# Patient Record
Sex: Female | Born: 1985 | Race: White | Hispanic: No | Marital: Married | Smoking: Never smoker
Health system: Southern US, Community
[De-identification: ages and names within clinical notes are randomized; demographics above are authoritative.]

## PROBLEM LIST (undated history)

## (undated) DIAGNOSIS — R87629 Unspecified abnormal cytological findings in specimens from vagina: Secondary | ICD-10-CM

## (undated) HISTORY — PX: WISDOM TOOTH EXTRACTION: SHX21

## (undated) HISTORY — DX: Unspecified abnormal cytological findings in specimens from vagina: R87.629

---

## 2020-06-23 LAB — OB RESULTS CONSOLE RPR: RPR: NONREACTIVE

## 2020-06-23 LAB — OB RESULTS CONSOLE ABO/RH: RH Type: POSITIVE

## 2020-06-23 LAB — OB RESULTS CONSOLE GC/CHLAMYDIA
Chlamydia: NEGATIVE
Gonorrhea: NEGATIVE

## 2020-06-23 LAB — OB RESULTS CONSOLE HIV ANTIBODY (ROUTINE TESTING): HIV: NONREACTIVE

## 2020-06-23 LAB — OB RESULTS CONSOLE HEPATITIS B SURFACE ANTIGEN: Hepatitis B Surface Ag: NEGATIVE

## 2020-06-23 LAB — OB RESULTS CONSOLE RUBELLA ANTIBODY, IGM: Rubella: IMMUNE

## 2020-10-22 ENCOUNTER — Inpatient Hospital Stay (HOSPITAL_COMMUNITY): Admission: AD | Admit: 2020-10-22 | Payer: 59 | Source: Home / Self Care | Admitting: Obstetrics and Gynecology

## 2021-01-21 ENCOUNTER — Inpatient Hospital Stay (HOSPITAL_COMMUNITY): Payer: BC Managed Care – PPO | Admitting: Anesthesiology

## 2021-01-21 ENCOUNTER — Encounter (HOSPITAL_COMMUNITY): Payer: Self-pay | Admitting: Obstetrics and Gynecology

## 2021-01-21 ENCOUNTER — Other Ambulatory Visit: Payer: Self-pay

## 2021-01-21 ENCOUNTER — Inpatient Hospital Stay (HOSPITAL_COMMUNITY)
Admission: AD | Admit: 2021-01-21 | Discharge: 2021-01-23 | DRG: 788 | Disposition: A | Discharge status: Home / Self Care | Payer: BC Managed Care – PPO | Attending: Obstetrics & Gynecology | Admitting: Obstetrics & Gynecology

## 2021-01-21 ENCOUNTER — Encounter (HOSPITAL_COMMUNITY)
Admission: AD | Disposition: A | Discharge status: Home / Self Care | Payer: Self-pay | Source: Home / Self Care | Attending: Obstetrics & Gynecology

## 2021-01-21 DIAGNOSIS — Z8616 Personal history of COVID-19: Secondary | ICD-10-CM

## 2021-01-21 DIAGNOSIS — O26893 Other specified pregnancy related conditions, third trimester: Secondary | ICD-10-CM | POA: Diagnosis present

## 2021-01-21 DIAGNOSIS — Z3A39 39 weeks gestation of pregnancy: Secondary | ICD-10-CM

## 2021-01-21 DIAGNOSIS — Z98891 History of uterine scar from previous surgery: Secondary | ICD-10-CM

## 2021-01-21 DIAGNOSIS — O99824 Streptococcus B carrier state complicating childbirth: Secondary | ICD-10-CM | POA: Diagnosis present

## 2021-01-21 DIAGNOSIS — O9081 Anemia of the puerperium: Secondary | ICD-10-CM | POA: Diagnosis not present

## 2021-01-21 LAB — TYPE AND SCREEN
ABO/RH(D): O POS
Antibody Screen: NEGATIVE

## 2021-01-21 LAB — CBC
HCT: 35.9 % — ABNORMAL LOW (ref 36.0–46.0)
Hemoglobin: 11.3 g/dL — ABNORMAL LOW (ref 12.0–15.0)
MCH: 28.6 pg (ref 26.0–34.0)
MCHC: 31.5 g/dL (ref 30.0–36.0)
MCV: 90.9 fL (ref 80.0–100.0)
Platelets: 203 10*3/uL (ref 150–400)
RBC: 3.95 MIL/uL (ref 3.87–5.11)
RDW: 13.9 % (ref 11.5–15.5)
WBC: 12.2 10*3/uL — ABNORMAL HIGH (ref 4.0–10.5)
nRBC: 0 % (ref 0.0–0.2)

## 2021-01-21 LAB — OB RESULTS CONSOLE GBS: GBS: POSITIVE

## 2021-01-21 LAB — RPR: RPR Ser Ql: NONREACTIVE

## 2021-01-21 LAB — POCT FERN TEST

## 2021-01-21 SURGERY — Surgical Case
Anesthesia: Epidural

## 2021-01-21 MED ORDER — ONDANSETRON HCL 4 MG/2ML IJ SOLN: 4.0000 mg | Freq: Three times a day (TID) | INTRAMUSCULAR | Status: DC | PRN

## 2021-01-21 MED ORDER — OXYTOCIN-SODIUM CHLORIDE 30-0.9 UT/500ML-% IV SOLN
INTRAVENOUS | Status: AC
  Filled 2021-01-21: qty 500

## 2021-01-21 MED ORDER — OXYTOCIN-SODIUM CHLORIDE 30-0.9 UT/500ML-% IV SOLN
INTRAVENOUS | Status: DC | PRN
  Administered 2021-01-21: 400 mL via INTRAVENOUS

## 2021-01-21 MED ORDER — PENICILLIN G POT IN DEXTROSE 60000 UNIT/ML IV SOLN
3.0000 10*6.[IU] | INTRAVENOUS | Status: DC
  Administered 2021-01-21 (×2): 3 10*6.[IU] via INTRAVENOUS
  Filled 2021-01-21 (×2): qty 50

## 2021-01-21 MED ORDER — MENTHOL 3 MG MT LOZG: 1.0000 | LOZENGE | OROMUCOSAL | Status: DC | PRN

## 2021-01-21 MED ORDER — SODIUM CHLORIDE 0.9 % IV SOLN
500.0000 mg | Freq: Once | INTRAVENOUS | Status: AC
  Administered 2021-01-21: 500 mg via INTRAVENOUS

## 2021-01-21 MED ORDER — SIMETHICONE 80 MG PO CHEW
80.0000 mg | CHEWABLE_TABLET | Freq: Three times a day (TID) | ORAL | Status: DC
  Administered 2021-01-21 – 2021-01-23 (×5): 80 mg via ORAL
  Filled 2021-01-21 (×4): qty 1

## 2021-01-21 MED ORDER — MEPERIDINE HCL 25 MG/ML IJ SOLN: 6.2500 mg | INTRAMUSCULAR | Status: DC | PRN

## 2021-01-21 MED ORDER — OXYTOCIN BOLUS FROM INFUSION: 333.0000 mL | Freq: Once | INTRAVENOUS | Status: DC

## 2021-01-21 MED ORDER — ONDANSETRON HCL 4 MG/2ML IJ SOLN
INTRAMUSCULAR | Status: DC | PRN
  Administered 2021-01-21: 4 mg via INTRAVENOUS

## 2021-01-21 MED ORDER — FLEET ENEMA 7-19 GM/118ML RE ENEM: 1.0000 | ENEMA | Freq: Every day | RECTAL | Status: DC | PRN

## 2021-01-21 MED ORDER — NALBUPHINE HCL 10 MG/ML IJ SOLN: 5.0000 mg | Freq: Once | INTRAMUSCULAR | Status: DC | PRN

## 2021-01-21 MED ORDER — NALBUPHINE HCL 10 MG/ML IJ SOLN: 5.0000 mg | INTRAMUSCULAR | Status: DC | PRN

## 2021-01-21 MED ORDER — LACTATED RINGERS IV SOLN
500.0000 mL | INTRAVENOUS | Status: DC | PRN
  Administered 2021-01-21: 250 mL via INTRAVENOUS

## 2021-01-21 MED ORDER — LACTATED RINGERS IV SOLN: 500.0000 mL | Freq: Once | INTRAVENOUS | Status: DC

## 2021-01-21 MED ORDER — LACTATED RINGERS IV SOLN: INTRAVENOUS | Status: DC | PRN

## 2021-01-21 MED ORDER — EPHEDRINE 5 MG/ML INJ: 10.0000 mg | INTRAVENOUS | Status: DC | PRN

## 2021-01-21 MED ORDER — BISACODYL 10 MG RE SUPP: 10.0000 mg | Freq: Every day | RECTAL | Status: DC | PRN

## 2021-01-21 MED ORDER — PRENATAL MULTIVITAMIN CH
1.0000 | ORAL_TABLET | Freq: Every day | ORAL | Status: DC
  Administered 2021-01-22 – 2021-01-23 (×2): 1 via ORAL
  Filled 2021-01-21 (×2): qty 1

## 2021-01-21 MED ORDER — DEXAMETHASONE SODIUM PHOSPHATE 4 MG/ML IJ SOLN
INTRAMUSCULAR | Status: AC
  Filled 2021-01-21: qty 1

## 2021-01-21 MED ORDER — FLEET ENEMA 7-19 GM/118ML RE ENEM: 1.0000 | ENEMA | RECTAL | Status: DC | PRN

## 2021-01-21 MED ORDER — SCOPOLAMINE 1 MG/3DAYS TD PT72: 1.0000 | MEDICATED_PATCH | Freq: Once | TRANSDERMAL | Status: DC

## 2021-01-21 MED ORDER — ONDANSETRON HCL 4 MG/2ML IJ SOLN
INTRAMUSCULAR | Status: AC
  Filled 2021-01-21: qty 2

## 2021-01-21 MED ORDER — SOD CITRATE-CITRIC ACID 500-334 MG/5ML PO SOLN
30.0000 mL | ORAL | Status: DC | PRN
  Administered 2021-01-21: 30 mL via ORAL
  Filled 2021-01-21: qty 15

## 2021-01-21 MED ORDER — KETOROLAC TROMETHAMINE 30 MG/ML IJ SOLN
30.0000 mg | Freq: Four times a day (QID) | INTRAMUSCULAR | Status: AC
  Administered 2021-01-21 – 2021-01-22 (×4): 30 mg via INTRAVENOUS
  Filled 2021-01-21 (×4): qty 1

## 2021-01-21 MED ORDER — DIBUCAINE (PERIANAL) 1 % EX OINT
1.0000 | TOPICAL_OINTMENT | CUTANEOUS | Status: DC | PRN
Start: 2021-01-21 — End: 2021-01-23

## 2021-01-21 MED ORDER — TERBUTALINE SULFATE 1 MG/ML IJ SOLN: 0.2500 mg | Freq: Once | INTRAMUSCULAR | Status: DC | PRN

## 2021-01-21 MED ORDER — SENNOSIDES-DOCUSATE SODIUM 8.6-50 MG PO TABS
2.0000 | ORAL_TABLET | ORAL | Status: DC
  Administered 2021-01-22 – 2021-01-23 (×2): 2 via ORAL
  Filled 2021-01-21 (×2): qty 2

## 2021-01-21 MED ORDER — SODIUM CHLORIDE 0.9 % IV SOLN
INTRAVENOUS | Status: AC
  Filled 2021-01-21: qty 500

## 2021-01-21 MED ORDER — LIDOCAINE HCL (PF) 1 % IJ SOLN: 30.0000 mL | INTRAMUSCULAR | Status: DC | PRN

## 2021-01-21 MED ORDER — FENTANYL CITRATE (PF) 100 MCG/2ML IJ SOLN
INTRAMUSCULAR | Status: AC
  Filled 2021-01-21: qty 2

## 2021-01-21 MED ORDER — MORPHINE SULFATE (PF) 0.5 MG/ML IJ SOLN
INTRAMUSCULAR | Status: DC | PRN
  Administered 2021-01-21: 3 mg via EPIDURAL

## 2021-01-21 MED ORDER — OXYCODONE-ACETAMINOPHEN 5-325 MG PO TABS: 2.0000 | ORAL_TABLET | ORAL | Status: DC | PRN

## 2021-01-21 MED ORDER — OXYTOCIN-SODIUM CHLORIDE 30-0.9 UT/500ML-% IV SOLN
1.0000 m[IU]/min | INTRAVENOUS | Status: DC
  Administered 2021-01-21: 2 m[IU]/min via INTRAVENOUS

## 2021-01-21 MED ORDER — MORPHINE SULFATE (PF) 0.5 MG/ML IJ SOLN
INTRAMUSCULAR | Status: AC
  Filled 2021-01-21: qty 10

## 2021-01-21 MED ORDER — MEPERIDINE HCL 25 MG/ML IJ SOLN
INTRAMUSCULAR | Status: AC
  Filled 2021-01-21: qty 1

## 2021-01-21 MED ORDER — OXYTOCIN-SODIUM CHLORIDE 30-0.9 UT/500ML-% IV SOLN
2.5000 [IU]/h | INTRAVENOUS | Status: DC
  Filled 2021-01-21: qty 500

## 2021-01-21 MED ORDER — NALOXONE HCL 4 MG/10ML IJ SOLN
1.0000 ug/kg/h | INTRAVENOUS | Status: DC | PRN
  Filled 2021-01-21: qty 5

## 2021-01-21 MED ORDER — COCONUT OIL OIL
1.0000 | TOPICAL_OIL | Status: DC | PRN
Start: 2021-01-21 — End: 2021-01-23

## 2021-01-21 MED ORDER — OXYCODONE HCL 5 MG PO TABS
5.0000 mg | ORAL_TABLET | ORAL | Status: DC | PRN
  Filled 2021-01-21: qty 2

## 2021-01-21 MED ORDER — PHENYLEPHRINE 40 MCG/ML (10ML) SYRINGE FOR IV PUSH (FOR BLOOD PRESSURE SUPPORT): 80.0000 ug | PREFILLED_SYRINGE | INTRAVENOUS | Status: DC | PRN

## 2021-01-21 MED ORDER — SODIUM CHLORIDE 0.9 % IR SOLN
Status: DC | PRN
  Administered 2021-01-21: 1

## 2021-01-21 MED ORDER — ACETAMINOPHEN 500 MG PO TABS
1000.0000 mg | ORAL_TABLET | Freq: Four times a day (QID) | ORAL | Status: DC
  Administered 2021-01-21 – 2021-01-23 (×7): 1000 mg via ORAL
  Filled 2021-01-21 (×6): qty 2

## 2021-01-21 MED ORDER — CEFAZOLIN SODIUM-DEXTROSE 2-4 GM/100ML-% IV SOLN
2.0000 g | INTRAVENOUS | Status: AC
  Administered 2021-01-21: 2 g via INTRAVENOUS

## 2021-01-21 MED ORDER — LIDOCAINE-EPINEPHRINE (PF) 2 %-1:200000 IJ SOLN
INTRAMUSCULAR | Status: DC | PRN
  Administered 2021-01-21: 5 mL via EPIDURAL
  Administered 2021-01-21: 3 mL via EPIDURAL
  Administered 2021-01-21: 5 mL via EPIDURAL
  Administered 2021-01-21: 4 mL via EPIDURAL

## 2021-01-21 MED ORDER — LACTATED RINGERS IV SOLN: INTRAVENOUS | Status: DC

## 2021-01-21 MED ORDER — NALOXONE HCL 0.4 MG/ML IJ SOLN
0.4000 mg | INTRAMUSCULAR | Status: DC | PRN
Start: 2021-01-21 — End: 2021-01-23

## 2021-01-21 MED ORDER — DIPHENHYDRAMINE HCL 25 MG PO CAPS: 25.0000 mg | ORAL_CAPSULE | Freq: Four times a day (QID) | ORAL | Status: DC | PRN

## 2021-01-21 MED ORDER — MEPERIDINE HCL 25 MG/ML IJ SOLN
INTRAMUSCULAR | Status: DC | PRN
  Administered 2021-01-21 (×2): 12.5 mg via INTRAVENOUS

## 2021-01-21 MED ORDER — FENTANYL-BUPIVACAINE-NACL 0.5-0.125-0.9 MG/250ML-% EP SOLN
12.0000 mL/h | EPIDURAL | Status: DC | PRN
  Administered 2021-01-21: 12 mL/h via EPIDURAL

## 2021-01-21 MED ORDER — ZOLPIDEM TARTRATE 5 MG PO TABS: 5.0000 mg | ORAL_TABLET | Freq: Every evening | ORAL | Status: DC | PRN

## 2021-01-21 MED ORDER — SODIUM CHLORIDE 0.9% FLUSH: 3.0000 mL | INTRAVENOUS | Status: DC | PRN

## 2021-01-21 MED ORDER — FENTANYL CITRATE (PF) 100 MCG/2ML IJ SOLN
25.0000 ug | INTRAMUSCULAR | Status: DC | PRN
  Administered 2021-01-21: 50 ug via INTRAVENOUS

## 2021-01-21 MED ORDER — HYDROMORPHONE HCL 1 MG/ML IJ SOLN: 0.2000 mg | INTRAMUSCULAR | Status: DC | PRN

## 2021-01-21 MED ORDER — OXYCODONE-ACETAMINOPHEN 5-325 MG PO TABS: 1.0000 | ORAL_TABLET | ORAL | Status: DC | PRN

## 2021-01-21 MED ORDER — SOD CITRATE-CITRIC ACID 500-334 MG/5ML PO SOLN: 30.0000 mL | ORAL | Status: DC

## 2021-01-21 MED ORDER — DIPHENHYDRAMINE HCL 50 MG/ML IJ SOLN: 12.5000 mg | INTRAMUSCULAR | Status: DC | PRN

## 2021-01-21 MED ORDER — DEXAMETHASONE SODIUM PHOSPHATE 4 MG/ML IJ SOLN
INTRAMUSCULAR | Status: DC | PRN
  Administered 2021-01-21: 4 mg via INTRAVENOUS

## 2021-01-21 MED ORDER — SIMETHICONE 80 MG PO CHEW: 80.0000 mg | CHEWABLE_TABLET | ORAL | Status: DC | PRN

## 2021-01-21 MED ORDER — IBUPROFEN 800 MG PO TABS
800.0000 mg | ORAL_TABLET | Freq: Four times a day (QID) | ORAL | Status: DC
  Administered 2021-01-22 – 2021-01-23 (×4): 800 mg via ORAL
  Filled 2021-01-21 (×4): qty 1

## 2021-01-21 MED ORDER — ACETAMINOPHEN 325 MG PO TABS: 650.0000 mg | ORAL_TABLET | ORAL | Status: DC | PRN

## 2021-01-21 MED ORDER — OXYTOCIN-SODIUM CHLORIDE 30-0.9 UT/500ML-% IV SOLN
2.5000 [IU]/h | INTRAVENOUS | Status: AC
  Administered 2021-01-21: 2.5 [IU]/h via INTRAVENOUS
  Filled 2021-01-21: qty 500

## 2021-01-21 MED ORDER — ONDANSETRON HCL 4 MG/2ML IJ SOLN: 4.0000 mg | Freq: Four times a day (QID) | INTRAMUSCULAR | Status: DC | PRN

## 2021-01-21 MED ORDER — FENTANYL-BUPIVACAINE-NACL 0.5-0.125-0.9 MG/250ML-% EP SOLN
EPIDURAL | Status: AC
  Filled 2021-01-21: qty 250

## 2021-01-21 MED ORDER — WITCH HAZEL-GLYCERIN EX PADS
1.0000 | MEDICATED_PAD | CUTANEOUS | Status: DC | PRN
Start: 2021-01-21 — End: 2021-01-23

## 2021-01-21 MED ORDER — SODIUM CHLORIDE 0.9 % IV SOLN
5.0000 10*6.[IU] | Freq: Once | INTRAVENOUS | Status: AC
  Administered 2021-01-21: 5 10*6.[IU] via INTRAVENOUS
  Filled 2021-01-21: qty 5

## 2021-01-21 MED ORDER — DIPHENHYDRAMINE HCL 25 MG PO CAPS: 25.0000 mg | ORAL_CAPSULE | ORAL | Status: DC | PRN

## 2021-01-21 SURGICAL SUPPLY — 35 items
BENZOIN TINCTURE PRP APPL 2/3 (GAUZE/BANDAGES/DRESSINGS) ×2 IMPLANT
CHLORAPREP W/TINT 26ML (MISCELLANEOUS) ×2 IMPLANT
CLAMP CORD UMBIL (MISCELLANEOUS) IMPLANT
CLOSURE STERI STRIP 1/2 X4 (GAUZE/BANDAGES/DRESSINGS) ×2 IMPLANT
CLOTH BEACON ORANGE TIMEOUT ST (SAFETY) ×2 IMPLANT
DRSG OPSITE POSTOP 4X10 (GAUZE/BANDAGES/DRESSINGS) ×2 IMPLANT
ELECT REM PT RETURN 9FT ADLT (ELECTROSURGICAL) ×2
ELECTRODE REM PT RTRN 9FT ADLT (ELECTROSURGICAL) ×1 IMPLANT
EXTRACTOR VACUUM KIWI (MISCELLANEOUS) ×2 IMPLANT
GLOVE BIO SURGEON STRL SZ 6 (GLOVE) ×2 IMPLANT
GLOVE BIOGEL PI IND STRL 6 (GLOVE) ×1 IMPLANT
GLOVE BIOGEL PI IND STRL 7.0 (GLOVE) ×1 IMPLANT
GLOVE BIOGEL PI INDICATOR 6 (GLOVE) ×1
GLOVE BIOGEL PI INDICATOR 7.0 (GLOVE) ×1
GOWN STRL REUS W/TWL LRG LVL3 (GOWN DISPOSABLE) ×4 IMPLANT
KIT ABG SYR 3ML LUER SLIP (SYRINGE) IMPLANT
NEEDLE HYPO 25X5/8 SAFETYGLIDE (NEEDLE) IMPLANT
NS IRRIG 1000ML POUR BTL (IV SOLUTION) ×2 IMPLANT
PACK C SECTION WH (CUSTOM PROCEDURE TRAY) ×2 IMPLANT
PAD OB MATERNITY 4.3X12.25 (PERSONAL CARE ITEMS) ×2 IMPLANT
PENCIL SMOKE EVAC W/HOLSTER (ELECTROSURGICAL) ×2 IMPLANT
RTRCTR C-SECT PINK 25CM LRG (MISCELLANEOUS) ×2 IMPLANT
STRIP CLOSURE SKIN 1/2X4 (GAUZE/BANDAGES/DRESSINGS) ×2 IMPLANT
SUT MNCRL 0 VIOLET CTX 36 (SUTURE) ×1 IMPLANT
SUT MONOCRYL 0 CTX 36 (SUTURE) ×1
SUT PDS AB 0 CTX 60 (SUTURE) ×2 IMPLANT
SUT PLAIN 0 NONE (SUTURE) IMPLANT
SUT VIC AB 0 CTX 36 (SUTURE) ×2
SUT VIC AB 0 CTX36XBRD ANBCTRL (SUTURE) ×2 IMPLANT
SUT VIC AB 2-0 CT1 27 (SUTURE) ×4
SUT VIC AB 2-0 CT1 TAPERPNT 27 (SUTURE) ×4 IMPLANT
SUT VIC AB 4-0 KS 27 (SUTURE) ×2 IMPLANT
TOWEL OR 17X24 6PK STRL BLUE (TOWEL DISPOSABLE) ×2 IMPLANT
TRAY FOLEY W/BAG SLVR 14FR LF (SET/KITS/TRAYS/PACK) ×2 IMPLANT
WATER STERILE IRR 1000ML POUR (IV SOLUTION) ×2 IMPLANT

## 2021-01-21 NOTE — Brief Op Note (Signed)
01/21/2021  4:17 PM  PATIENT:  Brianna Stewart  34 y.o. female  PRE-OPERATIVE DIAGNOSIS:  primary cesarean section; arrest of descent  POST-OPERATIVE DIAGNOSIS:  same  PROCEDURE:  Procedure(s): CESAREAN SECTION (N/A)  SURGEON:  Surgeon(s) and Role:    * Taam-Akelman, Griselda Miner, MD - Primary   ANESTHESIA:   epidural  EBL: 226cc  BLOOD ADMINISTERED:none  DRAINS: none   LOCAL MEDICATIONS USED:  NONE  SPECIMEN:  Source of Specimen:  placenta  DISPOSITION OF SPECIMEN:  L&D  COUNTS:  YES  TOURNIQUET:  * No tourniquets in log *  DICTATION: .Note written in EPIC  PLAN OF CARE: Admit to inpatient   PATIENT DISPOSITION:  PACU - hemodynamically stable.   Delay start of Pharmacological VTE agent (>24hrs) due to surgical blood loss or risk of bleeding: not applicable

## 2021-01-21 NOTE — Anesthesia Postprocedure Evaluation (Signed)
Anesthesia Post Note  Patient: Brianna Stewart  Procedure(s) Performed: CESAREAN SECTION (N/A )     Patient location during evaluation: PACU Anesthesia Type: Epidural Level of consciousness: awake and alert Pain management: pain level controlled Vital Signs Assessment: post-procedure vital signs reviewed and stable Respiratory status: spontaneous breathing and respiratory function stable Cardiovascular status: blood pressure returned to baseline and stable Postop Assessment: spinal receding Anesthetic complications: no   No complications documented.  Last Vitals:  Vitals:   01/21/21 1700 01/21/21 1715  BP: 131/66 120/69  Pulse: 82 78  Resp: 19 18  Temp:    SpO2: 100% 98%    Last Pain:  Vitals:   01/21/21 1645  TempSrc: Axillary  PainSc:    Pain Goal:                Epidural/Spinal Function Cutaneous sensation: Able to Wiggle Toes (01/21/21 1700), Patient able to flex knees: No (01/21/21 1700), Patient able to lift hips off bed: No (01/21/21 1700), Back pain beyond tenderness at insertion site: No (01/21/21 1700), Progressively worsening motor and/or sensory loss: No (01/21/21 1700), Bowel and/or bladder incontinence post epidural: No (01/21/21 1700)  Haru Anspaugh DANIEL

## 2021-01-21 NOTE — Plan of Care (Signed)

## 2021-01-21 NOTE — MAU Note (Signed)
Patient states contractions started around 0030, with SROM of clear fluid at this same time.

## 2021-01-21 NOTE — Anesthesia Procedure Notes (Signed)
Epidural Patient location during procedure: OB Start time: 01/21/2021 5:45 AM End time: 01/21/2021 5:55 AM  Staffing Anesthesiologist: Elmer Picker, MD Performed: anesthesiologist   Preanesthetic Checklist Completed: patient identified, IV checked, risks and benefits discussed, monitors and equipment checked, pre-op evaluation and timeout performed  Epidural Patient position: sitting Prep: DuraPrep and site prepped and draped Patient monitoring: continuous pulse ox, blood pressure, heart rate and cardiac monitor Approach: midline Location: L3-L4 Injection technique: LOR air  Needle:  Needle type: Tuohy  Needle gauge: 17 G Needle length: 9 cm Needle insertion depth: 4 cm Catheter type: closed end flexible Catheter size: 19 Gauge Catheter at skin depth: 10 cm Test dose: negative  Assessment Sensory level: T8 Events: blood not aspirated, injection not painful, no injection resistance, no paresthesia and negative IV test  Additional Notes Patient identified. Risks/Benefits/Options discussed with patient including but not limited to bleeding, infection, nerve damage, paralysis, failed block, incomplete pain control, headache, blood pressure changes, nausea, vomiting, reactions to medication both or allergic, itching and postpartum back pain. Confirmed with bedside nurse the patient's most recent platelet count. Confirmed with patient that they are not currently taking any anticoagulation, have any bleeding history or any family history of bleeding disorders. Patient expressed understanding and wished to proceed. All questions were answered. Sterile technique was used throughout the entire procedure. Please see nursing notes for vital signs. Test dose was given through epidural catheter and negative prior to continuing to dose epidural or start infusion. Warning signs of high block given to the patient including shortness of breath, tingling/numbness in hands, complete motor block,  or any concerning symptoms with instructions to call for help. Patient was given instructions on fall risk and not to get out of bed. All questions and concerns addressed with instructions to call with any issues or inadequate analgesia.  Reason for block:procedure for pain

## 2021-01-21 NOTE — Op Note (Signed)
Procedure(s): CESAREAN SECTION Procedure Note  Brianna Stewart female 35 y.o. 01/21/2021  Procedure(s) and Anesthesia Type:    * CESAREAN SECTION - Regional  Surgeon(s) and Role:    * Taam-Akelman, Griselda Miner, MD - Primary  Indications: Brianna Stewart 35 y.o. [id number] [redacted]w[redacted]d admitted this AM for SROM/labor, progressed well to 10/100/0, labored down for approximately 45 mins and then pushed for 3 hours, progressed to 10/100/+2 but in the last 1.5 hours of pushing had minimal change. Fetal status reassuring. EFW 8lb 4oz by recent US and 8# by leopolds. Counseled patient on continued pushing for 1 more hour or cesarean section for arrest of descent, patient opted for primary cesarean section.      Surgeon: Rande Brunt   Anesthesia: Epidural anesthesia   Procedure Detail  CESAREAN SECTION  Findings: Normal mullerian anatomy.  Viable female infant "Max" with weight 4015g (8pounds 13.6ounces) Apgars 9 and 9.   Estimated Blood Loss: 376cc         Specimens: Placenta to L&D         Complications:  None         Disposition: PACU - hemodynamically stable.         Condition: stable    Description of Procedure: The patient was taken to the operating room where epidural anesthesia was found to be adequate.  The patient was placed in the dorsal supine position.  Fetal heart tones were confirmed.  The patient was subsequently prepped and draped in the normal sterile fashion.    A low transverse skin incision was made with a scalpel and carried down to the level of the fascia with the Bovie.  The fascia was incised in the midline with the scalpel and extended laterally with curved Mayo scissors.  Kocher clamps were applied to the superior fascial edge and the fascia was dissected off the rectus muscle sharply using the scalpel.  The Kocher clamps were transferred to the inferior fascial edge and the underlying rectus muscle was dissected off with curved Mayo's scissors.  The rectus  muscles then were separated in the midline.  The peritoneum was found free of adherent bowel and the peritoneal cavity was entered sharply.  The uterus was identified and the alexis retractor was placed intraperitoneal.  A bladder flap was then created sharply with Metzenbaum scissors and separated from the lower uterine segment digitally.   A low transverse hysterotomy was then made with a scalpel.  The infant was found in the cephalic presentation was delivered atraumatically, with the assistance of kiwi vacuum, and without difficulty in the usual fashion. 2 nuchal cords easily reduced. After 60 seconds of delayed cord clamping the cord was clamped and cut and the infant was handed off to the pediatricians.  The placenta was delivered with gentle traction on umbilical cord and manual massage of the uterine fundus.  The uterus was cleared of all clot and debris.  The hysterotomy was then closed with 0 Monocryl in a running locked fashion.  Followed by 0 Monocryl in an imbricating fashion.  The hysterotomy was found to be hemostatic.  The peritoneum was closed with 2-0 Vicryl in a running fashion.  The fascia was closed with a 0 Vicryl suture in a continuous running fashion from each side and tied in the midline.  The subcutaneous tissue was irrigated and rendered hemostatic with cautery.  The subcutaneous layer was subsequently closed with 2-0 Vicryl in a continuous running fashion.  The skin was closed with 3-0 Monocryl in  a running subcuticular fashion.  Sponge, lap and needle counts were correct. Honeycomb dressing was placed on the incision.   Jaryn Rosko K Taam-Akelman 01/21/21 4:18 PM

## 2021-01-21 NOTE — Anesthesia Preprocedure Evaluation (Signed)

## 2021-01-21 NOTE — Lactation Note (Signed)
This note was copied from a baby's chart. Lactation Consultation Note  Patient Name: Brianna Stewart Today's Date: 01/21/2021 Reason for consult: Initial assessment;Term;Primapara;1st time breastfeeding Age:34 hours  Initial visit to 4 hours old infant of a P1 mother. Infant is sleeping in basinet upon arrival. Mother states infant was not able to latch after delivery. Offered assistance to latch. Set up support pillows for football position to right breast. Several attempts made to latch infant but he seems uninterested and sleepy. Talked to mother about hand expression and demonstrated technique. Colostrum easily expressed and collected ~33mL in a spoon. Mother spoonfed infant.    Plan: 1-Breastfeeding on demand, ensuring a deep, comfortable latch.  2-Offer breast 8-12 times in 24h period to establish good milk supply. 3-Undressing infant and place skin to skin when ready to breastfeed 4-Keep infant awake during breastfeeding session: massaging breast, infant's hand/shoulder/feet 5-Monitor voids and stools as signs good intake.  6-Encouraged maternal rest, hydration and food intake.  7-Contact LC as needed for feeds/support/concerns/questions   All questions answered at this time. Provided Lactation services brochure and promoted [employer] booklet information.    Maternal Data Has patient been taught Hand Expression?: Yes Does the patient have breastfeeding experience prior to this delivery?: No  Feeding Mother's Current Feeding Choice: Breast Milk  LATCH Score Latch: Too sleepy or reluctant, no latch achieved, no sucking elicited.  Audible Swallowing: None  Type of Nipple: Everted at rest and after stimulation (short nipples)  Comfort (Breast/Nipple): Soft / non-tender  Hold (Positioning): Assistance needed to correctly position infant at breast and maintain latch.  LATCH Score: 5  Interventions Interventions: Breast feeding basics reviewed;Assisted with latch;Skin to  skin;Breast massage;Hand express;Adjust position;Support pillows;Position options;Expressed milk;Education  Discharge Pump: Personal WIC Program: No  Consult Status Consult Status: Follow-up Date: 01/22/21 Follow-up type: In-patient    Jenisha Faison A Higuera Ancidey 01/21/2021, 8:26 PM

## 2021-01-21 NOTE — H&P (Signed)
35 y.o. [redacted]w[redacted]d  G1P0 comes in c/o SROM approx 0030.  Otherwise has good fetal movement and no bleeding.  PMHX:  OB History  Gravida Para Term Preterm AB Living  1            SAB IAB Ectopic Multiple Live Births               # Outcome Date GA Lbr Len/2nd Weight Sex Delivery Anes PTL Lv  1 Current             Social History   Socioeconomic History   Marital status: Married    Spouse name: Not on file   Number of children: Not on file   Years of education: Not on file   Highest education level: Not on file  Occupational History   Not on file  Tobacco Use   Smoking status: Not on file   Smokeless tobacco: Not on file  Substance and Sexual Activity   Alcohol use: Not on file   Drug use: Not on file   Sexual activity: Not on file  Other Topics Concern   Not on file  Social History Narrative   Not on file   Social Determinants of Health   Financial Resource Strain: Not on file  Food Insecurity: Not on file  Transportation Needs: Not on file  Physical Activity: Not on file  Stress: Not on file  Social Connections: Not on file  Intimate Partner Violence: Not on file   Patient has no known allergies.    Prenatal Transfer Tool  Maternal Diabetes: No Genetic Screening: Normal Maternal Ultrasounds/Referrals: Other: possible 1.7 cm unbilical cord insertion cyst; rescan at 22 weeks-resolved, otherwise normal anatomy Fetal Ultrasounds or other Referrals:  None Maternal Substance Abuse:  No Significant Maternal Medications:  None Significant Maternal Lab Results: Group B Strep positive  Other PNC: S>D: last EFW 01/18/2021: 8#4 (77.6%) 3750g    Vitals:   01/21/21 0345  BP: 129/81  Pulse: 90  Resp: 20  Temp: 97.7 F (36.5 C)  TempSrc: Oral  SpO2: 100%  Weight: 73.3 kg  Height: 5\' 5"  (1.651 m)    Lungs/Cor:  NAD Abdomen:  soft, gravid Ex:  no cords, erythema SVE:  4/90/-1 FHTs:  140, good STV, NST R; Cat 1 tracing. Toco:  q 2-4   A/P   Admit  G2P0010 @39 .4 with SROM  GBS Pos: PCN  Epidural desired  EFW by leopolds 8#, last 04-23-1991 estimate on Friday: 8#4, both patient and husband were 8/5# babies. S/p tdap and covid vaccines. Pt had covid 12/10/2020, unsure if pt had booster.  Other routine care.  Friday

## 2021-01-21 NOTE — Transfer of Care (Signed)
Immediate Anesthesia Transfer of Care Note  Patient: Brianna Stewart  Procedure(s) Performed: CESAREAN SECTION (N/A )  Patient Location: PACU  Anesthesia Type:Epidural  Level of Consciousness: awake, alert  and patient cooperative  Airway & Oxygen Therapy: Patient Spontanous Breathing  Post-op Assessment: Report given to RN and Post -op Vital signs reviewed and stable  Post vital signs: Reviewed and stable  Last Vitals:  Vitals Value Taken Time  BP 115/63 01/21/21 1624  Temp    Pulse 98 01/21/21 1626  Resp 25 01/21/21 1626  SpO2 100 % 01/21/21 1626  Vitals shown include unvalidated device data.  Last Pain:  Vitals:   01/21/21 1625  TempSrc: (P) Oral  PainSc:          Complications: No complications documented.

## 2021-01-22 LAB — CBC
HCT: 26 % — ABNORMAL LOW (ref 36.0–46.0)
Hemoglobin: 8.7 g/dL — ABNORMAL LOW (ref 12.0–15.0)
MCH: 29.7 pg (ref 26.0–34.0)
MCHC: 33.5 g/dL (ref 30.0–36.0)
MCV: 88.7 fL (ref 80.0–100.0)
Platelets: 148 10*3/uL — ABNORMAL LOW (ref 150–400)
RBC: 2.93 MIL/uL — ABNORMAL LOW (ref 3.87–5.11)
RDW: 14.3 % (ref 11.5–15.5)
WBC: 12.7 10*3/uL — ABNORMAL HIGH (ref 4.0–10.5)
nRBC: 0 % (ref 0.0–0.2)

## 2021-01-22 NOTE — Progress Notes (Signed)
Subjective: Postpartum Day 1: Cesarean Delivery Patient reports pain controlled, no nausea or vomiting, ambulating and voiding without difficulty  Objective: Vital signs in last 24 hours: Temp:  [97.9 F (36.6 C)-99.7 F (37.6 C)] 98 F (36.7 C) (03/22 0800) Pulse Rate:  [69-125] 69 (03/22 0800) Resp:  [16-24] 17 (03/22 0800) BP: (94-139)/(53-84) 96/56 (03/22 0800) SpO2:  [94 %-100 %] 96 % (03/22 0800)  Physical Exam:  General: alert, cooperative and appears stated age Lochia: appropriate Uterine Fundus: firm Incision: healing well DVT Evaluation: No evidence of DVT seen on physical exam.  Recent Labs    01/21/21 0422 01/22/21 0734  HGB 11.3* 8.7*  HCT 35.9* 26.0*    Assessment/Plan: Status post Cesarean section. Doing well postoperatively.  Continue current care. Hgb 11.3 to 8.7, blood loss anemia due to cesarean. Continue PNV Desires neonatal circumcision, R/B/A of procedure discussed at length. Pt understands that neonatal circumcision is not considered medically necessary and is elective. The risks include, but are not limited to bleeding, infection, damage to the penis, development of scar tissue, and having to have it redone at a later date. Pt understands theses risks and wishes to proceed   Brianna Stewart 01/22/2021, 12:49 PM

## 2021-01-23 MED ORDER — ACETAMINOPHEN 325 MG PO TABS: 650.0000 mg | ORAL_TABLET | Freq: Four times a day (QID) | ORAL | Status: DC

## 2021-01-23 MED ORDER — FERROUS SULFATE 325 (65 FE) MG PO TBEC: 325.0000 mg | DELAYED_RELEASE_TABLET | Freq: Every day | ORAL | 3 refills | Status: DC

## 2021-01-23 MED ORDER — IBUPROFEN 200 MG PO TABS: 600.0000 mg | ORAL_TABLET | Freq: Four times a day (QID) | ORAL | Status: DC | PRN

## 2021-01-23 NOTE — Progress Notes (Signed)
Subjective: Postpartum Day 2: Cesarean Delivery [place] is doing well this morning with no complaints. Reports good pain control. Tolerating PO, ambulating, voiding, Appropriate lochia. Breastfeeding.  Objective: Patient Vitals for the past 24 hrs:  BP Temp Temp src Pulse Resp SpO2  01/23/21 0524 108/69 98 F (36.7 C) Oral 61 16 98 %  01/22/21 2212 109/73 98.3 F (36.8 C) Oral 74 17 98 %  01/22/21 1340 (!) 103/58 -- -- 71 -- --    Physical Exam:  General: alert, cooperative and no distress Lochia: appropriate Uterine Fundus: firm Incision: healing well, no significant drainage, no dehiscence, no significant erythema DVT Evaluation: No evidence of DVT seen on physical exam.  Recent Labs    01/21/21 0422 01/22/21 0734  HGB 11.3* 8.7*  HCT 35.9* 26.0*    Assessment/Plan: [place] G[place] G2P1011 POD#2 sp primary cesarean at [redacted]w[redacted]d for arrest of descent 1. PPC: continue routine postpartum care 2. Rh pos, rubella immune. S/p Tdap and COVID vaccines.  3. Dispo: stable for discharge home. Discharge instructions reviewed.   [name] 01/23/2021, 8:02 AM

## 2021-01-23 NOTE — Discharge Instructions (Signed)

## 2021-01-23 NOTE — Discharge Summary (Signed)
Postpartum Discharge Summary    Patient Name: Brianna Stewart DOB: [id number] MRN: [id number]  Date of admission: 01/21/2021 Delivery date:01/21/2021  Delivering provider: Lyda Kalata K  Date of discharge: 01/23/2021  Admitting diagnosis: Normal labor and delivery [O80] Intrauterine pregnancy: [redacted]w[redacted]d    Secondary diagnosis:  Active Problems:   Normal labor and delivery   S/P C-section  Additional problems: postpartum anemia    Discharge diagnosis: Term Pregnancy Delivered and Anemia                                              Post partum procedures:none Augmentation: N/A Complications: None  Hospital course: Onset of Labor With Unplanned C/S   35y.o. yo G2P1011 at 369w4das [place] [facility] [place]. The patient went for cesarean section due to Arrest of Descent. Delivery details as follows: Membrane Rupture Time/Date: 12:30 AM ,01/21/2021   Delivery Method:C-Section, Low Transverse  Details of operation can be found in separate operative note. Patient had an uncomplicated postpartum course.  She is ambulating,tolerating a regular diet, passing flatus, and urinating well.  Patient is discharged home in stable condition 01/23/21.  Newborn Data: Birth date:01/21/2021  Birth time:3:39 PM  Gender:Female  Living status:Living  Apgars:9 ,9  Weight:4015 g   Magnesium Sulfate received: No BMZ received: No Rhophylac:N/A MMR:N/A T-DaP:Given prenatally Flu: No Transfusion:No  Physical exam  Vitals:   01/22/21 0800 01/22/21 1340 01/22/21 2212 01/23/21 0524  BP: (!) 96/56 (!) 103/58 109/73 108/69  Pulse: 69 71 74 61  Resp: _0 Temp: 98 F (36.7 C)  98.3 F (36.8 C) 98 F (36.7 C)  TempSrc: Oral  Oral Oral  SpO2: 96%  98% 98%  Weight:      Height:       General: alert, cooperative and no distress Lochia: appropriate Uterine Fundus: firm Incision: Healing well with no significant drainage, No significant erythema, Dressing is clean, dry, and  intact DVT Evaluation: No evidence of DVT seen on physical exam. Labs: Lab Results  Component Value Date   WBC 12.7 (H) 01/22/2021   HGB 8.7 (L) 01/22/2021   HCT 26.0 (L) 01/22/2021   MCV 88.7 01/22/2021   PLT 148 (L) 01/22/2021   No flowsheet data found. Edinburgh Score: Edinburgh Postnatal Depression Scale Screening Tool 01/21/2021  I have been able to laugh and see the funny side of things. 0  I have looked forward with enjoyment to things. 0  I have blamed myself unnecessarily when things went wrong. 1  I have been anxious or worried for no good reason. 0  I have felt scared or panicky for no good reason. 0  Things have been getting on top of me. 0  I have been so unhappy that I have had difficulty sleeping. 0  I have felt sad or miserable. 0  I have been so unhappy that I have been crying. 0  The thought of harming myself has occurred to me. 0  Edinburgh Postnatal Depression Scale Total 1      After visit meds:  Allergies as of 01/23/2021   No Known Allergies     Medication List    TAKE these medications   acetaminophen 325 MG tablet Commonly known as: TYLENOL Take 2 tablets (650 mg total) by mouth every 6 (six) hours.   famotidine 20 MG  tablet Commonly known as: PEPCID Take 20 mg by mouth 2 (two) times daily.   ibuprofen 200 MG tablet Commonly known as: ADVIL Take 3 tablets (600 mg total) by mouth every 6 (six) hours as needed.   prenatal multivitamin Tabs tablet Take 1 tablet by mouth daily at 12 noon.        Discharge home in stable condition Infant Feeding: Breast Infant Disposition:home with mother Discharge instruction: per After Visit Summary and Postpartum booklet. Activity: Advance as tolerated. Pelvic rest for 6 weeks.  Diet: routine diet Anticipated Birth Control: Unsure Postpartum Appointment:4 weeks Additional Postpartum F/U: none Future Appointments:No future appointments. Follow up Visit:  Follow-up Information    Taam-Akelman,  Lawrence Santiago, MD. Schedule an appointment as soon as possible for a visit in 4 week(s).   Specialty: Obstetrics and Gynecology Contact information: [address] [place] [address] [phone]                   01/23/2021 Rowland Lathe, MD

## 2021-04-22 ENCOUNTER — Other Ambulatory Visit: Payer: Self-pay | Admitting: Obstetrics & Gynecology

## 2021-04-22 DIAGNOSIS — N63 Unspecified lump in unspecified breast: Secondary | ICD-10-CM

## 2021-05-07 ENCOUNTER — Other Ambulatory Visit: Payer: Self-pay

## 2021-05-07 ENCOUNTER — Ambulatory Visit
Admission: RE | Admit: 2021-05-07 | Discharge: 2021-05-07 | Disposition: A | Discharge status: Home / Self Care | Payer: PRIVATE HEALTH INSURANCE | Source: Ambulatory Visit | Attending: Obstetrics & Gynecology | Admitting: Obstetrics & Gynecology

## 2021-05-07 ENCOUNTER — Ambulatory Visit
Admission: RE | Admit: 2021-05-07 | Discharge: 2021-05-07 | Disposition: A | Discharge status: Home / Self Care | Payer: BC Managed Care – PPO | Source: Ambulatory Visit | Attending: Obstetrics & Gynecology | Admitting: Obstetrics & Gynecology

## 2021-05-07 DIAGNOSIS — N63 Unspecified lump in unspecified breast: Secondary | ICD-10-CM

## 2021-05-14 ENCOUNTER — Other Ambulatory Visit: Payer: BC Managed Care – PPO

## 2021-11-05 IMAGING — MG DIGITAL DIAGNOSTIC BILAT W/ TOMO W/ CAD
6 of 10 series · 6 of 30 positions shown · non-contrast
Comparison: None.

CLINICAL DATA: 35-year-old female with a palpable right breast lump
for 1 month. The patient ceased breast feeding 2 months ago.

EXAM:
DIGITAL DIAGNOSTIC BILATERAL MAMMOGRAM WITH TOMOSYNTHESIS AND CAD;
ULTRASOUND RIGHT BREAST LIMITED
TECHNIQUE: Bilateral digital diagnostic mammography and breast tomosynthesis
was performed. The images were evaluated with computer-aided
detection.; Targeted ultrasound examination of the right breast was
performed

[L CC synth-2D]
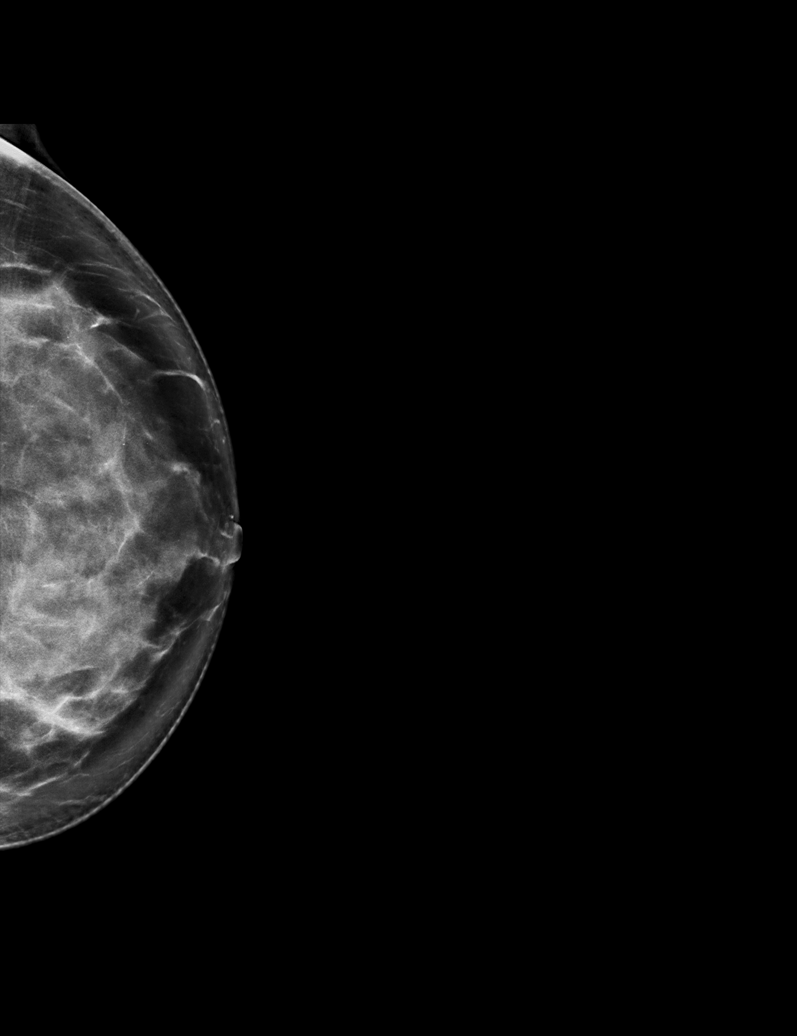

[R CC synth-2D]
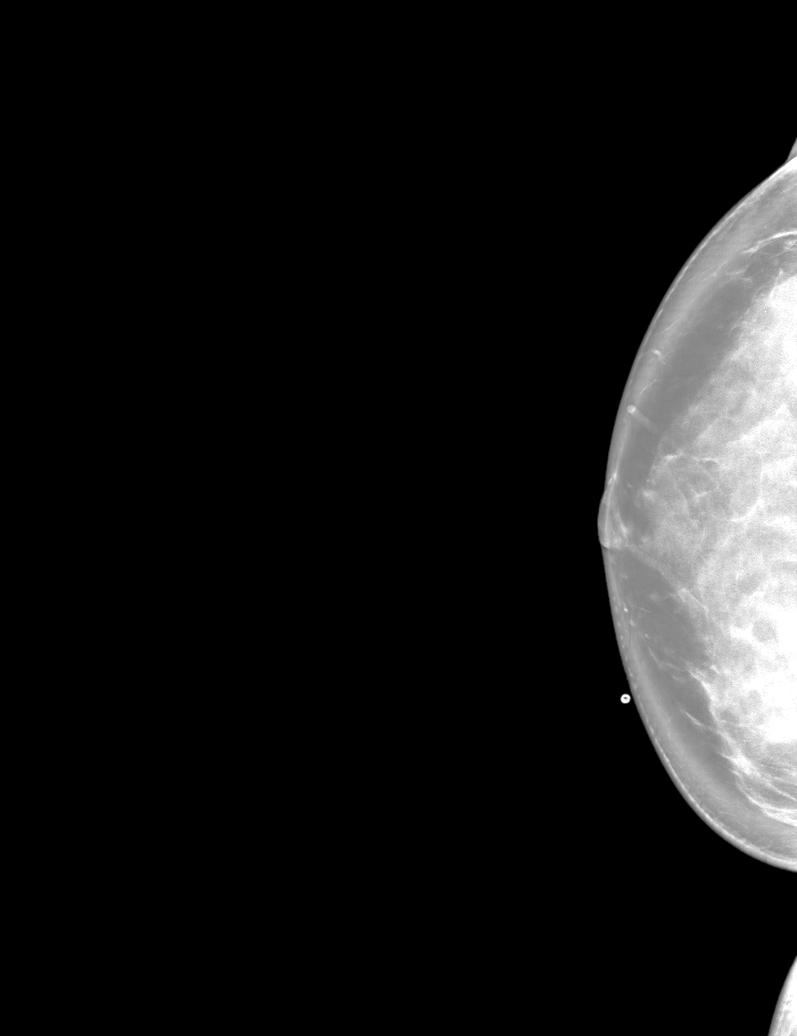

[R MLO synth-2D]
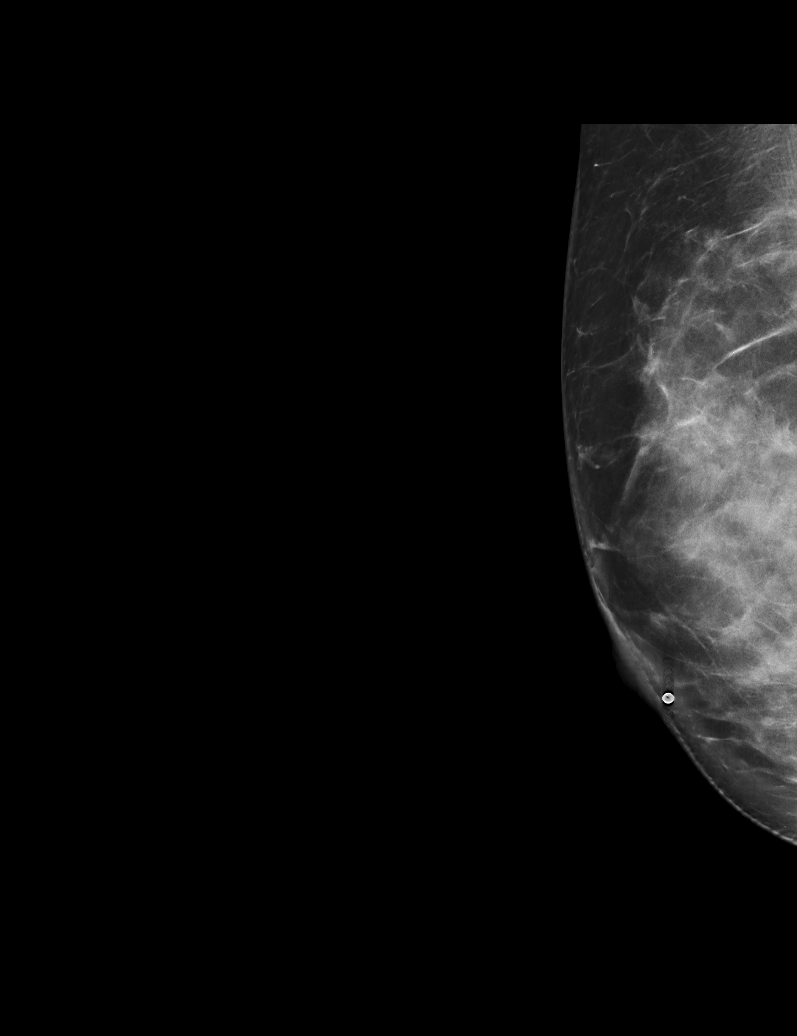

[R TAN synth-2D]
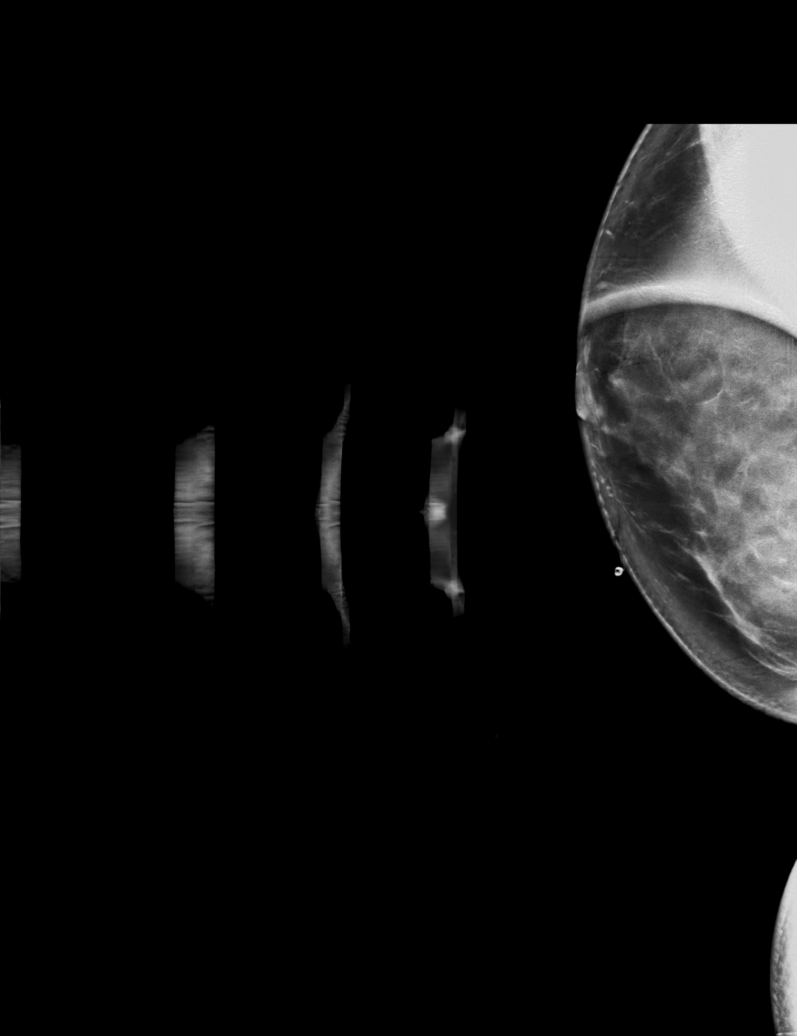

[L MLO synth-2D]
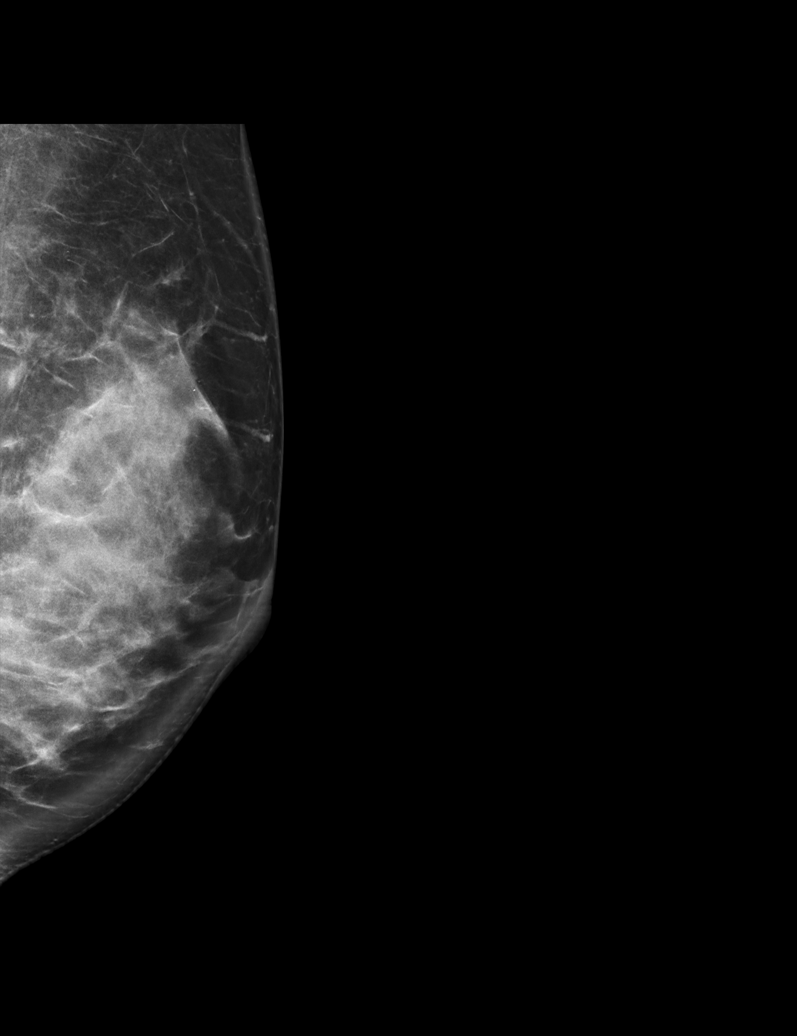

[L MLO tomo · tomo slice 44/87.0]
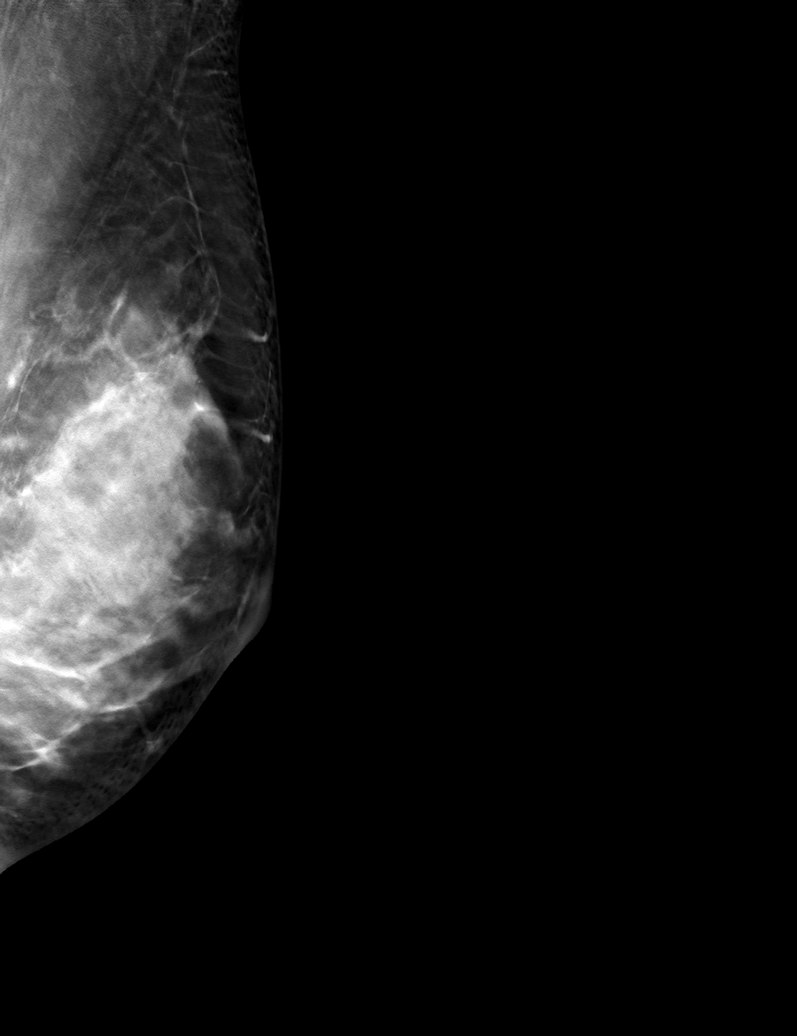

[6 of 30 positions shown; findings below may reference images not displayed]

ACR Breast Density Category d: The breast tissue is extremely dense,
which lowers the sensitivity of mammography.
FINDINGS: A radiopaque BB was placed at the site of the patient's palpable
lump in the far medial right breast. A partially circumscribed
masses partially visualized deep to the radiopaque BB. No additional
suspicious findings in the remainder of either breast.

Targeted ultrasound is performed, showing an oval, circumscribed
anechoic mass with a single thin internal septation at the 3 o'clock
position 3 cm from the nipple. It measures 4.7 x 3.1 x 2.4 cm. There
is no internal vascularity. This correlates with the patient's
palpable lump in is consistent with a benign simple cyst.
IMPRESSION: 1. Benign right breast simple cyst corresponding with the patient's
palpable lump. No further imaging follow-up required.
2. Otherwise, no mammographic evidence of malignancy in either
breast.

RECOMMENDATION:
Screening mammogram at age 41 unless there are persistent or
intervening clinical concerns. (Code:3P-C-RJX)

I have discussed the findings and recommendations with the patient.
If applicable, a reminder letter will be sent to the patient
regarding the next appointment.

BI-RADS CATEGORY  2: Benign.

## 2022-03-27 LAB — OB RESULTS CONSOLE GC/CHLAMYDIA
Chlamydia: NEGATIVE
Neisseria Gonorrhea: NEGATIVE

## 2022-04-11 LAB — OB RESULTS CONSOLE HIV ANTIBODY (ROUTINE TESTING): HIV: NONREACTIVE

## 2022-04-11 LAB — HEPATITIS C ANTIBODY: HCV Ab: NEGATIVE

## 2022-04-11 LAB — OB RESULTS CONSOLE RUBELLA ANTIBODY, IGM: Rubella: IMMUNE

## 2022-04-11 LAB — OB RESULTS CONSOLE RPR: RPR: NONREACTIVE

## 2022-04-11 LAB — OB RESULTS CONSOLE HEPATITIS B SURFACE ANTIGEN: Hepatitis B Surface Ag: NEGATIVE

## 2022-04-11 LAB — OB RESULTS CONSOLE ANTIBODY SCREEN: Antibody Screen: NEGATIVE

## 2022-04-23 ENCOUNTER — Ambulatory Visit: Payer: PRIVATE HEALTH INSURANCE | Attending: Obstetrics and Gynecology

## 2022-04-23 ENCOUNTER — Ambulatory Visit: Payer: Self-pay | Admitting: Genetics

## 2022-04-23 DIAGNOSIS — O285 Abnormal chromosomal and genetic finding on antenatal screening of mother: Secondary | ICD-10-CM

## 2022-04-23 DIAGNOSIS — Z3A12 12 weeks gestation of pregnancy: Secondary | ICD-10-CM

## 2022-04-23 NOTE — Progress Notes (Signed)
Name: Brianna Stewart Indication: Atypical cfDNA suspected to be of fetal/placental origin  DOB: Jul 13, 1986 Age: 36 y.o.   EDC: 11/02/2022 LMP: 01/13/2022 Referring Provider:  [name], MD  EGA: [redacted]w[redacted]d Genetic Counselor: Teena Dunk, MS, CGC  OB Hx: [id number] Date of Appointment: 04/23/2022  Accompanied by: Effie Berkshire Student Face to Face Time: 40 Minutes   Previous Testing Completed: Brianna previously completed carrier screening. She screened to not be a carrier for 14 conditions on the [employer] Panel (scanned into Epic under the Media tab). A negative result on carrier screening reduces the likelihood of being a carrier, however, does not entirely rule out the possibility.   Medical History:  This is Brianna Stewart's 3rd pregnancy. She has a living, healthy son. She has had one spontaneous loss. Reports she takes prenatal vitamins and propranolol. Reports she consumed a small amount of alcohol before she learned she was pregnant towards the end of April. Reports she has not had any alcohol since she learned of her pregnancy. Denies personal history of diabetes, high blood pressure, thyroid conditions, and seizures. Denies bleeding, infections, and fevers in this pregnancy. Denies using tobacco or street drugs in this pregnancy.   Family History: A pedigree was created and scanned into Epic under the Media tab. Maternal ethnicity reported as [place] and [place] and paternal ethnicity reported as [place], [place], and [place]. Denies Ashkenazi Jewish ancestry. Family history not remarkable for consanguinity, individuals with birth defects, intellectual disability, autism spectrum disorder, multiple spontaneous abortions, still births, or unexplained neonatal death.     Genetic Counseling:   Atypical Cell-Free DNA Screening Result. Brianna previously completed cell-free DNA screening (cfDNA) in this pregnancy (scanned into Epic under the Media tab). We reviewed that this screen analyzes  cell-free DNA originating from the placenta that is found in the maternal blood circulation during pregnancy. This test can provide information regarding the presence or absence of extra placental DNA for chromosomes 13, 18, and 21 as well as the sex chromosomes. The result is low risk for Down syndrome (Trisomy 21), Trisomy 30, Trisomy 67, and Triploidy. For Kamira's cfDNA result, the laboratory reports an atypical finding involving the X chromosome suspected to be of fetal/placental origin. 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 The finding could also be due to normal variation and/or confined to the placental tissue. We reviewed this result in detail with Brianna. We reviewed the features of Monosomy X and discussed the possibility of a pregnancy that is mosaic for Monosomy X. We discussed that if the pregnancy has mosaic Monosomy X it would difficult to predict the clinical features and the severity of the features that an affected individual might have. We discussed that Brianna has the option of continuing the monitor the pregnancy via routine ultrasounds with the understanding that ultrasound may not detect all birth defects and a normal ultrasound does not guarantee a healthy pregnancy. If Brianna opted to monitor with only ultrasound examination, we discussed that her baby should have an evaluation by pediatric genetics after birth to discuss postnatal genetic testing. We also discussed the option of amniocentesis for prenatal diagnosis. Possible procedural difficulties and complications that can arise include maternal infection, cramping, bleeding, fluid leakage, and/or pregnancy loss. The risk for pregnancy loss with an amniocentesis is 1/500. Per the [employer]necologists (ACOG) Practice Bulletin 162, all pregnant women should be offered prenatal assessment for aneuploidy by diagnostic testing regardless of maternal age  or other risk factors. If indicated,  genetic testing that could be ordered on an amniocentesis sample includes a fetal karyotype, fetal microarray, and testing for specific syndromes. In this case we do not recommend diagnostic testing via [facility] given the possibility that this is confined to the placental tissue. After hearing the above information, Brianna declined amniocentesis for prenatal diagnosis and opted to continue with ultrasounds only.     Patient Plan:  Proceed with: Routine prenatal care Informed consent was obtained. All questions were answered.  Declined: Amniocentesis for prenatal diagnosis   Thank you for sharing in the care of Brianna with [place].  Please do not hesitate to contact us if you have any questions.  Teena Dunk, MS, CGC Certified Genetic Counselor  Genetic counseling student involved in appointment: Yes ([facility]

## 2022-05-15 ENCOUNTER — Other Ambulatory Visit: Payer: Self-pay

## 2022-06-06 ENCOUNTER — Encounter: Payer: Self-pay | Admitting: *Deleted

## 2022-06-10 ENCOUNTER — Other Ambulatory Visit: Payer: Self-pay | Admitting: Obstetrics and Gynecology

## 2022-06-10 DIAGNOSIS — Z3689 Encounter for other specified antenatal screening: Secondary | ICD-10-CM

## 2022-06-11 ENCOUNTER — Encounter: Payer: Self-pay | Admitting: *Deleted

## 2022-06-11 ENCOUNTER — Ambulatory Visit: Payer: BC Managed Care – PPO | Admitting: *Deleted

## 2022-06-11 ENCOUNTER — Other Ambulatory Visit: Payer: Self-pay | Admitting: *Deleted

## 2022-06-11 ENCOUNTER — Ambulatory Visit: Payer: BC Managed Care – PPO | Attending: Obstetrics and Gynecology

## 2022-06-11 ENCOUNTER — Ambulatory Visit: Payer: BC Managed Care – PPO

## 2022-06-11 ENCOUNTER — Ambulatory Visit: Payer: BC Managed Care – PPO | Attending: Obstetrics and Gynecology | Admitting: Obstetrics

## 2022-06-11 VITALS — BP 119/64 | HR 68

## 2022-06-11 DIAGNOSIS — O35DXX0 Maternal care for other (suspected) fetal abnormality and damage, fetal gastrointestinal anomalies, not applicable or unspecified: Secondary | ICD-10-CM | POA: Diagnosis present

## 2022-06-11 DIAGNOSIS — O358XX0 Maternal care for other (suspected) fetal abnormality and damage, not applicable or unspecified: Secondary | ICD-10-CM | POA: Diagnosis not present

## 2022-06-11 DIAGNOSIS — O09522 Supervision of elderly multigravida, second trimester: Secondary | ICD-10-CM | POA: Insufficient documentation

## 2022-06-11 DIAGNOSIS — O34219 Maternal care for unspecified type scar from previous cesarean delivery: Secondary | ICD-10-CM

## 2022-06-11 DIAGNOSIS — O280 Abnormal hematological finding on antenatal screening of mother: Secondary | ICD-10-CM

## 2022-06-11 DIAGNOSIS — Z3A19 19 weeks gestation of pregnancy: Secondary | ICD-10-CM

## 2022-06-11 DIAGNOSIS — O3503X0 Maternal care for (suspected) central nervous system malformation or damage in fetus, choroid plexus cysts, not applicable or unspecified: Secondary | ICD-10-CM

## 2022-06-11 DIAGNOSIS — Z3689 Encounter for other specified antenatal screening: Secondary | ICD-10-CM | POA: Insufficient documentation

## 2022-06-11 DIAGNOSIS — O285 Abnormal chromosomal and genetic finding on antenatal screening of mother: Secondary | ICD-10-CM

## 2022-06-11 NOTE — Progress Notes (Signed)
MFM Note  Brianna Stewart was seen for a detailed fetal anatomy scan due to advanced maternal age. 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 The cell free DNA test indicated a low risk for trisomy 21, 18, and 13. A female fetus is predicted.  She is currently treated with propranolol due to tremors.    She and denies any problems in her current pregnancy.     She was informed that the fetal growth and amniotic fluid level were appropriate for her gestational age.   On today's exam, the following were noted and discussed:  Unilateral choroid plexus cyst  The implications and management of choroid plexus cysts were discussed today.    She was advised that choroid plexus cysts are most likely normal variants and will usually resolve at around 24 weeks.    The small association of choroid plexus cysts with trisomy 18 was discussed.    She was reassured that as no other anomalies associated with trisomy 76 were noted on today's ultrasound exam, it is highly unlikely that her fetus has trisomy 34.   Echogenic bowel  The causes of echogenic bowel including a normal variant, fetal aneuploidy, swallowed  blood, cystic fibrosis, and viral infections were discussed.  The association of bowel malrotation/atresia associated with echogenic bowel was also discussed. She denies any recent vaginal bleeding, although she does remember that she had a first trimester ultrasound where she was told that there was old blood in the placenta or gestational sac.   She has screened negative for cystic fibrosis.  Due to the echogenic bowel noted today, she had her blood drawn to screen for an acute CMV and toxoplasmosis infection.  Abnormal cell free DNA test indicating atypical sex chromosomes  The patient was advised that the atypical findings noted on her cell free DNA test may be a normal variant  and/or confined to the placental tissue (confined placental mosaicism).  The increased risk of fetal growth restriction should confined placental mosaicism be present was discussed. Due to this indication, we will continue to follow her with growth ultrasounds throughout her pregnancy.  She was offered and declined an amniocentesis today for definitive diagnosis of fetal chromosomal abnormalities and fetal sex chromosome abnormalities.  The patient already met with our genetic counselor earlier in her pregnancy regarding the atypical findings on the cell free DNA test.  A follow up exam was scheduled in 4 weeks.   The patient stated that all of her questions had been answered.  A total of 30 minutes was spent counseling and coordinating the care for this patient.  Greater than 50% of the time was spent in direct face-to-face contact.

## 2022-06-12 LAB — TOXOPLASMA GONDII ANTIBODY, IGG: Toxoplasma IgG Ratio: <3 IU/mL (ref 0.0–7.1)

## 2022-06-12 LAB — INFECT DISEASE AB IGM REFLEX 1

## 2022-06-12 LAB — CMV IGM: CMV IgM Ser EIA-aCnc: <30 AU/mL (ref 0.0–29.9)

## 2022-06-12 LAB — CMV ANTIBODY, IGG (EIA): CMV Ab - IgG: <0.6 U/mL (ref 0.00–0.59)

## 2022-06-12 LAB — TOXOPLASMA GONDII ANTIBODY, IGM: Toxoplasma Antibody- IgM: <3 AU/mL (ref 0.0–7.9)

## 2022-07-11 ENCOUNTER — Ambulatory Visit: Payer: BC Managed Care – PPO | Attending: Obstetrics

## 2022-07-11 ENCOUNTER — Ambulatory Visit: Payer: BC Managed Care – PPO | Admitting: *Deleted

## 2022-07-11 VITALS — BP 110/55 | HR 72

## 2022-07-11 DIAGNOSIS — O280 Abnormal hematological finding on antenatal screening of mother: Secondary | ICD-10-CM | POA: Insufficient documentation

## 2022-07-11 DIAGNOSIS — O09522 Supervision of elderly multigravida, second trimester: Secondary | ICD-10-CM | POA: Insufficient documentation

## 2022-07-11 DIAGNOSIS — O34219 Maternal care for unspecified type scar from previous cesarean delivery: Secondary | ICD-10-CM

## 2022-07-11 DIAGNOSIS — Z3A23 23 weeks gestation of pregnancy: Secondary | ICD-10-CM

## 2022-07-14 ENCOUNTER — Other Ambulatory Visit: Payer: Self-pay | Admitting: *Deleted

## 2022-07-14 DIAGNOSIS — O09523 Supervision of elderly multigravida, third trimester: Secondary | ICD-10-CM

## 2022-07-14 DIAGNOSIS — O34219 Maternal care for unspecified type scar from previous cesarean delivery: Secondary | ICD-10-CM

## 2022-09-11 ENCOUNTER — Ambulatory Visit: Payer: BC Managed Care – PPO

## 2022-09-24 ENCOUNTER — Encounter: Payer: Self-pay | Admitting: *Deleted

## 2022-09-24 ENCOUNTER — Ambulatory Visit: Payer: BC Managed Care – PPO | Attending: Obstetrics and Gynecology

## 2022-09-24 ENCOUNTER — Ambulatory Visit: Payer: BC Managed Care – PPO | Admitting: *Deleted

## 2022-09-24 DIAGNOSIS — O34219 Maternal care for unspecified type scar from previous cesarean delivery: Secondary | ICD-10-CM

## 2022-09-24 DIAGNOSIS — O09523 Supervision of elderly multigravida, third trimester: Secondary | ICD-10-CM

## 2022-10-08 LAB — OB RESULTS CONSOLE GBS: GBS: NEGATIVE

## 2022-10-16 ENCOUNTER — Telehealth (HOSPITAL_COMMUNITY): Payer: Self-pay | Admitting: *Deleted

## 2022-10-16 NOTE — Telephone Encounter (Signed)
Preadmission screen  

## 2022-10-16 NOTE — Patient Instructions (Signed)
Brianna Stewart  10/16/2022   Your procedure is scheduled on:  10/29/2022  Arrive at 1015 at [employer] [facility] on [address] at [facility]  and [address]. You are invited to use the FREE valet parking or use the Visitor's parking deck.  Pick up the phone at the desk and dial [phone].  Call this number if you have problems the morning of surgery: [phone]  Remember:   Do not eat food:(After Midnight) Desps de medianoche.  Do not drink clear liquids: (After Midnight) Desps de medianoche.  Take these medicines the morning of surgery with A SIP OF WATER:  Take inderall as prescribed   Do not wear jewelry, make-up or nail polish.  Do not wear lotions, powders, or perfumes. Do not wear deodorant.  Do not shave 48 hours prior to surgery.  Do not bring valuables to the hospital.  [facility] is not   responsible for any belongings or valuables brought to the hospital.  Contacts, dentures or bridgework may not be worn into surgery.  Leave suitcase in the car. After surgery it may be brought to your room.  For patients admitted to the hospital, checkout time is 11:00 AM the day of              discharge.      Please read over the following fact sheets that you were given:     Preparing for Surgery

## 2022-10-17 ENCOUNTER — Encounter (HOSPITAL_COMMUNITY): Payer: Self-pay

## 2022-10-17 ENCOUNTER — Telehealth (HOSPITAL_COMMUNITY): Payer: Self-pay | Admitting: *Deleted

## 2022-10-17 NOTE — Telephone Encounter (Signed)
Preadmission screen  

## 2022-10-28 ENCOUNTER — Other Ambulatory Visit (HOSPITAL_COMMUNITY)
Admission: RE | Admit: 2022-10-28 | Discharge: 2022-10-28 | Disposition: A | Discharge status: Home / Self Care | Payer: BC Managed Care – PPO | Source: Ambulatory Visit | Attending: Obstetrics and Gynecology | Admitting: Obstetrics and Gynecology

## 2022-10-28 DIAGNOSIS — Z01818 Encounter for other preprocedural examination: Secondary | ICD-10-CM | POA: Insufficient documentation

## 2022-10-28 LAB — CBC WITH DIFFERENTIAL/PLATELET
Abs Immature Granulocytes: 0.05 10*3/uL (ref 0.00–0.07)
Basophils Absolute: 0 10*3/uL (ref 0.0–0.1)
Basophils Relative: 0 %
Eosinophils Absolute: 0 10*3/uL (ref 0.0–0.5)
Eosinophils Relative: 0 %
HCT: 34.6 % — ABNORMAL LOW (ref 36.0–46.0)
Hemoglobin: 11.7 g/dL — ABNORMAL LOW (ref 12.0–15.0)
Immature Granulocytes: 1 %
Lymphocytes Relative: 21 %
Lymphs Abs: 1.9 10*3/uL (ref 0.7–4.0)
MCH: 30.6 pg (ref 26.0–34.0)
MCHC: 33.8 g/dL (ref 30.0–36.0)
MCV: 90.6 fL (ref 80.0–100.0)
Monocytes Absolute: 0.5 10*3/uL (ref 0.1–1.0)
Monocytes Relative: 6 %
Neutro Abs: 6.3 10*3/uL (ref 1.7–7.7)
Neutrophils Relative %: 72 %
Platelets: 233 10*3/uL (ref 150–400)
RBC: 3.82 MIL/uL — ABNORMAL LOW (ref 3.87–5.11)
RDW: 13.5 % (ref 11.5–15.5)
WBC: 8.8 10*3/uL (ref 4.0–10.5)
nRBC: 0 % (ref 0.0–0.2)

## 2022-10-28 LAB — TYPE AND SCREEN
ABO/RH(D): O POS
Antibody Screen: NEGATIVE

## 2022-10-28 NOTE — H&P (Signed)
Brianna Stewart is a 36 y.o. female G3P1011 [redacted]w[redacted]d presenting for repeat cesarean delivery. She reports no LOF, VB, Contractions. Normal FM.   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 Saw MFM and Genetic counseling for consultation, declines amniocentesis.  MFM anatomy scan showed CPC and echogenic bowel, both resolved on follow up scan.  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 Social History:  reports that she has never smoked. She has never used smokeless tobacco. She reports that she does not currently use alcohol. She reports that she does not use drugs.     Maternal Diabetes: No Genetic Screening: Abnormal:  Results: Other: atypical sex chromosome finding Maternal Ultrasounds/Referrals: Isolated choroid plexus cyst and Echogenic bowel - resolved on 32 week scan Fetal Ultrasounds or other Referrals:  Referred to Materal Fetal Medicine , Other: and genetic counseling for abnormal NIPT Maternal Substance Abuse:  No Significant Maternal Medications:  Meds include: Other:  propanolol Significant Maternal Lab Results:  Group B Strep negative Other Comments:  None  Review of Systems Per HPI Exam Physical Exam    Last menstrual  period 01/13/2022, unknown if currently breastfeeding. Gen: NAD, resting comfortably CVS: normal pulses Lungs: nonlabored respirations Abd: Gravid abdomen Ext: no calf edema or tenderness   Prenatal labs: ABO, Rh:  --/--/O POS (12/26 07-18-1992) Antibody: NEG (12/26 0956) Rubella: Immune (06/09 0000) RPR: Nonreactive (06/09 0000)  HBsAg: Negative (06/09 0000)  HIV: Non-reactive (06/09 0000)  GBS: Negative/-- (12/06 0000)   Assessment/Plan: 36Y 05-23-1985 @ [redacted]w[redacted]d, elective repeat cesarean - Informed consent obtained. Reviewed risk of infection, bleeding, blood transfusion, hysterectomy, damage to surrounding organs, fetal laceration.  - Ancef 2g  [redacted]w[redacted]d 10/28/2022, 3:52 PM

## 2022-10-29 ENCOUNTER — Inpatient Hospital Stay (HOSPITAL_COMMUNITY): Payer: BC Managed Care – PPO | Admitting: Anesthesiology

## 2022-10-29 ENCOUNTER — Encounter (HOSPITAL_COMMUNITY)
Admission: RE | Disposition: A | Discharge status: Home / Self Care | Payer: Self-pay | Source: Home / Self Care | Attending: Obstetrics and Gynecology

## 2022-10-29 ENCOUNTER — Encounter (HOSPITAL_COMMUNITY): Payer: Self-pay | Admitting: Obstetrics and Gynecology

## 2022-10-29 ENCOUNTER — Inpatient Hospital Stay (HOSPITAL_COMMUNITY)
Admission: RE | Admit: 2022-10-29 | Discharge: 2022-10-31 | DRG: 787 | Disposition: A | Discharge status: Home / Self Care | Payer: BC Managed Care – PPO | Attending: Obstetrics and Gynecology | Admitting: Obstetrics and Gynecology

## 2022-10-29 DIAGNOSIS — O9081 Anemia of the puerperium: Secondary | ICD-10-CM | POA: Diagnosis not present

## 2022-10-29 DIAGNOSIS — Z3A39 39 weeks gestation of pregnancy: Secondary | ICD-10-CM

## 2022-10-29 DIAGNOSIS — D62 Acute posthemorrhagic anemia: Secondary | ICD-10-CM | POA: Diagnosis not present

## 2022-10-29 DIAGNOSIS — Z98891 History of uterine scar from previous surgery: Secondary | ICD-10-CM

## 2022-10-29 DIAGNOSIS — O34211 Maternal care for low transverse scar from previous cesarean delivery: Secondary | ICD-10-CM | POA: Diagnosis present

## 2022-10-29 LAB — RPR: RPR Ser Ql: NONREACTIVE

## 2022-10-29 SURGERY — Surgical Case
Anesthesia: Spinal

## 2022-10-29 MED ORDER — SCOPOLAMINE 1 MG/3DAYS TD PT72
MEDICATED_PATCH | TRANSDERMAL | Status: AC
  Filled 2022-10-29: qty 1

## 2022-10-29 MED ORDER — DIBUCAINE (PERIANAL) 1 % EX OINT: 1.0000 | TOPICAL_OINTMENT | CUTANEOUS | Status: DC | PRN

## 2022-10-29 MED ORDER — ACETAMINOPHEN 500 MG PO TABS
1000.0000 mg | ORAL_TABLET | Freq: Four times a day (QID) | ORAL | Status: DC
  Administered 2022-10-29 – 2022-10-30 (×6): 1000 mg via ORAL
  Filled 2022-10-29 (×7): qty 2

## 2022-10-29 MED ORDER — LACTATED RINGERS IV SOLN: INTRAVENOUS | Status: DC

## 2022-10-29 MED ORDER — OXYTOCIN-SODIUM CHLORIDE 30-0.9 UT/500ML-% IV SOLN: 2.5000 [IU]/h | INTRAVENOUS | Status: AC

## 2022-10-29 MED ORDER — OXYCODONE HCL 5 MG PO TABS: 5.0000 mg | ORAL_TABLET | Freq: Once | ORAL | Status: DC | PRN

## 2022-10-29 MED ORDER — OXYTOCIN-SODIUM CHLORIDE 30-0.9 UT/500ML-% IV SOLN
INTRAVENOUS | Status: DC | PRN
  Administered 2022-10-29: 300 mL via INTRAVENOUS

## 2022-10-29 MED ORDER — DIPHENHYDRAMINE HCL 25 MG PO CAPS: 25.0000 mg | ORAL_CAPSULE | Freq: Four times a day (QID) | ORAL | Status: DC | PRN

## 2022-10-29 MED ORDER — SOD CITRATE-CITRIC ACID 500-334 MG/5ML PO SOLN
ORAL | Status: AC
  Filled 2022-10-29: qty 30

## 2022-10-29 MED ORDER — OXYCODONE HCL 5 MG/5ML PO SOLN: 5.0000 mg | Freq: Once | ORAL | Status: DC | PRN

## 2022-10-29 MED ORDER — SCOPOLAMINE 1 MG/3DAYS TD PT72
1.0000 | MEDICATED_PATCH | TRANSDERMAL | Status: DC
  Administered 2022-10-29: 1.5 mg via TRANSDERMAL

## 2022-10-29 MED ORDER — OXYCODONE HCL 5 MG PO TABS: 5.0000 mg | ORAL_TABLET | ORAL | Status: DC | PRN

## 2022-10-29 MED ORDER — MORPHINE SULFATE (PF) 0.5 MG/ML IJ SOLN
INTRAMUSCULAR | Status: DC | PRN
  Administered 2022-10-29: 150 ug via INTRATHECAL

## 2022-10-29 MED ORDER — DEXAMETHASONE SODIUM PHOSPHATE 4 MG/ML IJ SOLN
INTRAMUSCULAR | Status: AC
  Filled 2022-10-29: qty 2

## 2022-10-29 MED ORDER — BUPIVACAINE IN DEXTROSE 0.75-8.25 % IT SOLN
INTRATHECAL | Status: DC | PRN
  Administered 2022-10-29: 1.6 mL via INTRATHECAL

## 2022-10-29 MED ORDER — KETOROLAC TROMETHAMINE 30 MG/ML IJ SOLN
30.0000 mg | Freq: Once | INTRAMUSCULAR | Status: AC | PRN
  Administered 2022-10-29: 30 mg via INTRAVENOUS

## 2022-10-29 MED ORDER — ONDANSETRON HCL 4 MG/2ML IJ SOLN
INTRAMUSCULAR | Status: AC
  Filled 2022-10-29: qty 2

## 2022-10-29 MED ORDER — PHENYLEPHRINE HCL-NACL 20-0.9 MG/250ML-% IV SOLN
INTRAVENOUS | Status: AC
  Filled 2022-10-29: qty 250

## 2022-10-29 MED ORDER — SENNOSIDES-DOCUSATE SODIUM 8.6-50 MG PO TABS
2.0000 | ORAL_TABLET | ORAL | Status: DC
  Administered 2022-10-30: 2 via ORAL
  Filled 2022-10-29: qty 2

## 2022-10-29 MED ORDER — PRENATAL MULTIVITAMIN CH
1.0000 | ORAL_TABLET | Freq: Every day | ORAL | Status: DC
  Administered 2022-10-30: 1 via ORAL
  Filled 2022-10-29: qty 1

## 2022-10-29 MED ORDER — PHENYLEPHRINE HCL (PRESSORS) 10 MG/ML IV SOLN
INTRAVENOUS | Status: DC | PRN
  Administered 2022-10-29: 160 ug via INTRAVENOUS

## 2022-10-29 MED ORDER — NALOXONE HCL 0.4 MG/ML IJ SOLN: 0.4000 mg | INTRAMUSCULAR | Status: DC | PRN

## 2022-10-29 MED ORDER — STERILE WATER FOR IRRIGATION IR SOLN
Status: DC | PRN
  Administered 2022-10-29: 1000 mL

## 2022-10-29 MED ORDER — SIMETHICONE 80 MG PO CHEW: 80.0000 mg | CHEWABLE_TABLET | ORAL | Status: DC | PRN

## 2022-10-29 MED ORDER — KETOROLAC TROMETHAMINE 30 MG/ML IJ SOLN
INTRAMUSCULAR | Status: AC
  Filled 2022-10-29: qty 1

## 2022-10-29 MED ORDER — ONDANSETRON HCL 4 MG/2ML IJ SOLN
INTRAMUSCULAR | Status: DC | PRN
  Administered 2022-10-29: 4 mg via INTRAVENOUS

## 2022-10-29 MED ORDER — HYDROMORPHONE HCL 1 MG/ML IJ SOLN: 0.2500 mg | INTRAMUSCULAR | Status: DC | PRN

## 2022-10-29 MED ORDER — NALOXONE HCL 4 MG/10ML IJ SOLN: 1.0000 ug/kg/h | INTRAVENOUS | Status: DC | PRN

## 2022-10-29 MED ORDER — IBUPROFEN 600 MG PO TABS
600.0000 mg | ORAL_TABLET | Freq: Four times a day (QID) | ORAL | Status: DC
  Administered 2022-10-30 – 2022-10-31 (×3): 600 mg via ORAL
  Filled 2022-10-29 (×3): qty 1

## 2022-10-29 MED ORDER — CEFAZOLIN SODIUM-DEXTROSE 2-4 GM/100ML-% IV SOLN
2.0000 g | INTRAVENOUS | Status: AC
  Administered 2022-10-29: 2 g via INTRAVENOUS

## 2022-10-29 MED ORDER — OXYTOCIN-SODIUM CHLORIDE 30-0.9 UT/500ML-% IV SOLN
INTRAVENOUS | Status: AC
  Filled 2022-10-29: qty 500

## 2022-10-29 MED ORDER — FENTANYL CITRATE (PF) 100 MCG/2ML IJ SOLN
INTRAMUSCULAR | Status: DC | PRN
  Administered 2022-10-29: 15 ug via INTRATHECAL

## 2022-10-29 MED ORDER — PHENYLEPHRINE 80 MCG/ML (10ML) SYRINGE FOR IV PUSH (FOR BLOOD PRESSURE SUPPORT)
PREFILLED_SYRINGE | INTRAVENOUS | Status: AC
  Filled 2022-10-29: qty 10

## 2022-10-29 MED ORDER — MORPHINE SULFATE (PF) 0.5 MG/ML IJ SOLN
INTRAMUSCULAR | Status: AC
  Filled 2022-10-29: qty 10

## 2022-10-29 MED ORDER — SODIUM CHLORIDE 0.9% FLUSH: 3.0000 mL | INTRAVENOUS | Status: DC | PRN

## 2022-10-29 MED ORDER — SOD CITRATE-CITRIC ACID 500-334 MG/5ML PO SOLN
30.0000 mL | ORAL | Status: AC
  Administered 2022-10-29: 30 mL via ORAL

## 2022-10-29 MED ORDER — COCONUT OIL OIL: 1.0000 | TOPICAL_OIL | Status: DC | PRN

## 2022-10-29 MED ORDER — ZOLPIDEM TARTRATE 5 MG PO TABS: 5.0000 mg | ORAL_TABLET | Freq: Every evening | ORAL | Status: DC | PRN

## 2022-10-29 MED ORDER — TETANUS-DIPHTH-ACELL PERTUSSIS 5-2.5-18.5 LF-MCG/0.5 IM SUSY: 0.5000 mL | PREFILLED_SYRINGE | Freq: Once | INTRAMUSCULAR | Status: DC

## 2022-10-29 MED ORDER — WITCH HAZEL-GLYCERIN EX PADS: 1.0000 | MEDICATED_PAD | CUTANEOUS | Status: DC | PRN

## 2022-10-29 MED ORDER — METOCLOPRAMIDE HCL 5 MG/ML IJ SOLN
INTRAMUSCULAR | Status: AC
  Filled 2022-10-29: qty 2

## 2022-10-29 MED ORDER — DEXAMETHASONE SODIUM PHOSPHATE 10 MG/ML IJ SOLN
INTRAMUSCULAR | Status: DC | PRN
  Administered 2022-10-29: 8 mg via INTRAVENOUS

## 2022-10-29 MED ORDER — CEFAZOLIN SODIUM-DEXTROSE 2-4 GM/100ML-% IV SOLN
INTRAVENOUS | Status: AC
  Filled 2022-10-29: qty 100

## 2022-10-29 MED ORDER — HYDROMORPHONE HCL 1 MG/ML IJ SOLN: 0.2000 mg | INTRAMUSCULAR | Status: DC | PRN

## 2022-10-29 MED ORDER — ACETAMINOPHEN 10 MG/ML IV SOLN
INTRAVENOUS | Status: DC | PRN
  Administered 2022-10-29: 1000 mg via INTRAVENOUS

## 2022-10-29 MED ORDER — DIPHENHYDRAMINE HCL 50 MG/ML IJ SOLN: 12.5000 mg | INTRAMUSCULAR | Status: DC | PRN

## 2022-10-29 MED ORDER — FENTANYL CITRATE (PF) 100 MCG/2ML IJ SOLN
INTRAMUSCULAR | Status: AC
  Filled 2022-10-29: qty 2

## 2022-10-29 MED ORDER — SODIUM CHLORIDE 0.9 % IR SOLN
Status: DC | PRN
  Administered 2022-10-29: 1000 mL

## 2022-10-29 MED ORDER — SODIUM CHLORIDE 0.9 % IV SOLN
INTRAVENOUS | Status: AC
  Filled 2022-10-29: qty 5

## 2022-10-29 MED ORDER — SIMETHICONE 80 MG PO CHEW
80.0000 mg | CHEWABLE_TABLET | Freq: Three times a day (TID) | ORAL | Status: DC
  Administered 2022-10-29 – 2022-10-30 (×4): 80 mg via ORAL
  Filled 2022-10-29 (×4): qty 1

## 2022-10-29 MED ORDER — METOCLOPRAMIDE HCL 5 MG/ML IJ SOLN
INTRAMUSCULAR | Status: DC | PRN
  Administered 2022-10-29: 10 mg via INTRAVENOUS

## 2022-10-29 MED ORDER — ACETAMINOPHEN 10 MG/ML IV SOLN
INTRAVENOUS | Status: AC
  Filled 2022-10-29: qty 100

## 2022-10-29 MED ORDER — MEPERIDINE HCL 25 MG/ML IJ SOLN: 6.2500 mg | INTRAMUSCULAR | Status: DC | PRN

## 2022-10-29 MED ORDER — MENTHOL 3 MG MT LOZG: 1.0000 | LOZENGE | OROMUCOSAL | Status: DC | PRN

## 2022-10-29 MED ORDER — PHENYLEPHRINE HCL-NACL 20-0.9 MG/250ML-% IV SOLN
INTRAVENOUS | Status: DC | PRN
  Administered 2022-10-29: 60 ug/min via INTRAVENOUS

## 2022-10-29 MED ORDER — DIPHENHYDRAMINE HCL 25 MG PO CAPS: 25.0000 mg | ORAL_CAPSULE | ORAL | Status: DC | PRN

## 2022-10-29 MED ORDER — SODIUM CHLORIDE 0.9 % IV SOLN
500.0000 mg | INTRAVENOUS | Status: AC
  Administered 2022-10-29: 500 mg via INTRAVENOUS

## 2022-10-29 MED ORDER — PROMETHAZINE HCL 25 MG/ML IJ SOLN: 6.2500 mg | INTRAMUSCULAR | Status: DC | PRN

## 2022-10-29 MED ORDER — KETOROLAC TROMETHAMINE 30 MG/ML IJ SOLN
30.0000 mg | Freq: Four times a day (QID) | INTRAMUSCULAR | Status: AC
  Administered 2022-10-29 – 2022-10-30 (×3): 30 mg via INTRAVENOUS
  Filled 2022-10-29 (×3): qty 1

## 2022-10-29 SURGICAL SUPPLY — 30 items
BENZOIN TINCTURE PRP APPL 2/3 (GAUZE/BANDAGES/DRESSINGS) ×1 IMPLANT
CHLORAPREP W/TINT 26 (MISCELLANEOUS) ×2 IMPLANT
CLAMP UMBILICAL CORD (MISCELLANEOUS) ×1 IMPLANT
CLOTH BEACON ORANGE TIMEOUT ST (SAFETY) ×1 IMPLANT
DRSG OPSITE POSTOP 4X10 (GAUZE/BANDAGES/DRESSINGS) ×1 IMPLANT
ELECT REM PT RETURN 9FT ADLT (ELECTROSURGICAL) ×1
ELECTRODE REM PT RTRN 9FT ADLT (ELECTROSURGICAL) ×1 IMPLANT
EXTRACTOR VACUUM BELL CUP MITY (SUCTIONS) IMPLANT
GAUZE SPONGE 4X4 12PLY STRL LF (GAUZE/BANDAGES/DRESSINGS) IMPLANT
GLOVE BIO SURGEON STRL SZ 6 (GLOVE) ×1 IMPLANT
GLOVE BIOGEL PI IND STRL 6.5 (GLOVE) ×1 IMPLANT
GOWN STRL REUS W/TWL LRG LVL3 (GOWN DISPOSABLE) ×2 IMPLANT
KIT ABG SYR 3ML LUER SLIP (SYRINGE) ×1 IMPLANT
NDL HYPO 25X5/8 SAFETYGLIDE (NEEDLE) ×1 IMPLANT
NEEDLE HYPO 25X5/8 SAFETYGLIDE (NEEDLE) ×1 IMPLANT
NS IRRIG 1000ML POUR BTL (IV SOLUTION) ×1 IMPLANT
PACK C SECTION WH (CUSTOM PROCEDURE TRAY) ×1 IMPLANT
PAD ABD 7.5X8 STRL (GAUZE/BANDAGES/DRESSINGS) IMPLANT
PAD OB MATERNITY 4.3X12.25 (PERSONAL CARE ITEMS) ×1 IMPLANT
RTRCTR C-SECT PINK 25CM LRG (MISCELLANEOUS) ×1 IMPLANT
STRIP CLOSURE SKIN 1/2X4 (GAUZE/BANDAGES/DRESSINGS) ×1 IMPLANT
SUT MNCRL 0 VIOLET CTX 36 (SUTURE) ×2 IMPLANT
SUT MONOCRYL 0 CTX 36 (SUTURE) ×2
SUT VIC AB 0 CT1 36 (SUTURE) ×2 IMPLANT
SUT VIC AB 3-0 CT1 27 (SUTURE) ×1
SUT VIC AB 3-0 CT1 TAPERPNT 27 (SUTURE) ×1 IMPLANT
SUT VIC AB 4-0 KS 27 (SUTURE) ×1 IMPLANT
TOWEL OR 17X24 6PK STRL BLUE (TOWEL DISPOSABLE) ×1 IMPLANT
TRAY FOLEY W/BAG SLVR 14FR LF (SET/KITS/TRAYS/PACK) ×1 IMPLANT
WATER STERILE IRR 1000ML POUR (IV SOLUTION) ×1 IMPLANT

## 2022-10-29 NOTE — Anesthesia Procedure Notes (Signed)
Spinal  Patient location during procedure: OB Start time: 10/29/2022 12:19 PM End time: 10/29/2022 12:24 PM Reason for block: surgical anesthesia Staffing Performed: anesthesiologist  Anesthesiologist: Lowella Curb, MD Performed by: Lowella Curb, MD Authorized by: Lowella Curb, MD   Preanesthetic Checklist Completed: patient identified, IV checked, risks and benefits discussed, surgical consent, monitors and equipment checked, pre-op evaluation and timeout performed Spinal Block Patient position: sitting Prep: DuraPrep and site prepped and draped Patient monitoring: heart rate, cardiac monitor, continuous pulse ox and blood pressure Approach: midline Location: L3-4 Injection technique: single-shot Needle Needle type: Pencan  Needle gauge: 24 G Needle length: 10 cm Assessment Sensory level: T4 Events: CSF return

## 2022-10-29 NOTE — Interval H&P Note (Signed)
History and Physical Interval Note:  10/29/2022 12:02 PM  Brianna Stewart  has presented today for surgery, with the diagnosis of Previous.  The various methods of treatment have been discussed with the patient and family. After consideration of risks, benefits and other options for treatment, the patient has consented to  Procedure(s): CESAREAN SECTION (N/A) as a surgical intervention.  The patient's history has been reviewed, patient examined, no change in status, stable for surgery.  I have reviewed the patient's chart and labs.  Questions were answered to the patient's satisfaction.     [name]

## 2022-10-29 NOTE — Anesthesia Postprocedure Evaluation (Signed)
Anesthesia Post Note  Patient: Brianna Stewart  Procedure(s) Performed: CESAREAN SECTION     Patient location during evaluation: PACU Anesthesia Type: Spinal Level of consciousness: awake and alert Pain management: pain level controlled Vital Signs Assessment: post-procedure vital signs reviewed and stable Respiratory status: spontaneous breathing, nonlabored ventilation and respiratory function stable Cardiovascular status: blood pressure returned to baseline and stable Postop Assessment: no apparent nausea or vomiting Anesthetic complications: no   No notable events documented.  Last Vitals:  Vitals:   10/29/22 1430 10/29/22 1440  BP: (!) 91/53 93/60  Pulse: 63 63  Resp: 12 16  Temp: 36.4 C 36.5 C  SpO2: 98% 99%    Last Pain:  Vitals:   10/29/22 1445  TempSrc:   PainSc: 0-No pain   Pain Goal: Patients Stated Pain Goal: 4 (10/29/22 1430)  LLE Motor Response: Purposeful movement (10/29/22 1430) LLE Sensation: Tingling (10/29/22 1430) RLE Motor Response: Purposeful movement (10/29/22 1430) RLE Sensation: Tingling (10/29/22 1430)     Epidural/Spinal Function Cutaneous sensation: Tingles (10/29/22 1445), Patient able to flex knees: Yes (10/29/22 1445), Patient able to lift hips off bed: Yes (10/29/22 1445), Back pain beyond tenderness at insertion site: No (10/29/22 1445), Progressively worsening motor and/or sensory loss: No (10/29/22 1445), Bowel and/or bladder incontinence post epidural: No (10/29/22 1445)  Lowella Curb

## 2022-10-29 NOTE — Op Note (Signed)
CESAREAN SECTION Procedure Note  Patient: Brianna Stewart is a 36 y.o. G3P1011 @ [redacted]w[redacted]d  Preoperative Diagnosis:  Intrauterine pregnancy at 39 weeks 3 days History of cesarean x 1, desire for repeat 3.  Advanced maternal age  Postoperative Diagnosis: same, delivered  Procedure: Repeat low transverse cesarean     Surgeon: [name] , MD  Assistant: Marlow Baars, MD  Anesthesia: Spinal anesthesia   Findings: Normal appearing uterus, fallopian tubes bilaterally, and ovaries bilaterally.  Viable female infant in vertex left occiput anterior presentation delivered at 1242 with weight 3720g (8 pounds 3.2ounces) Apgars 9 and 9. Loose nuchal cord reduced.   Estimated Blood Loss:          Specimens: Placenta to L&D for disposal         Complications:  None         Disposition: PACU - hemodynamically stable.         Condition: stable    Description of Procedure: The patient was taken to the operating room where spinal anesthesia was placed and found to be adequate.  The patient was placed in the dorsal supine position.  Fetal heart tones were confirmed. Thromboguards were applied and cycling. A foley catheter was inserted and draining. Ancef 2g was given for infection prophylaxis. The patient was subsequently prepped and draped in the normal sterile fashion.    A low transverse skin incision was made with a scalpel through the previous scar and carried down to the level of the fascia with the Bovie.  The fascia was incised in the midline with the scalpel and extended laterally with curved Mayo scissors.  Kocher clamps were applied to the inferior fascial edge and the fascia was dissected off the rectus muscle sharply using the Mayo scissors.  The Kocher clamps were transferred to the superior fascial edge and the underlying rectus muscle was dissected off with curved Mayo's scissors.  The rectus muscles then were separated in the midline.  The peritoneum was found free of  adherent bowel and the peritoneal cavity was entered with Metzenbaum scissors.  The uterus was identified and the alexis retractor was placed intraperitoneal.  A bladder flap was then created sharply with Metzenbaum scissors and separated from the lower uterine segment digitally.   A low transverse hysterotomy was then made with a scalpel.  The infant was found in the vertex presentation was delivered atraumatically and without difficulty with standard maneuvers. A loose nuchal cord was reduced.  After 60 seconds of delayed cord clamping the cord was clamped and cut and the infant was handed off to the pediatricians.  The placenta was delivered with gentle traction on umbilical cord and manual massage of the uterine fundus.  The uterus was cleared of all clot and debris.  The hysterotomy was then closed with 0 monocryl in a running locked fashion,  followed by 0 Monocryl in an imbricating fashion.  The hysterotomy was found to be hemostatic. The peritoneum and rectus muscles were reapproximated with 3-0 vicryl in a running fashion. The fascia was closed with a 0 Vicryl suture in a continuous running fashion.  The subcutaneous tissue was irrigated and rendered hemostatic with cautery.  The subcutaneous layer was subsequently closed with 3-0 Vicryl in a continuous running fashion.  The skin was closed with 4-0 vicryl  in a running subcuticular fashion.  Sponge, lap and needle counts were correct. Steri strips and a Honeycomb dressing were placed on the incision.  [name] 10/29/22  1:19  PM   

## 2022-10-29 NOTE — Anesthesia Preprocedure Evaluation (Signed)
Anesthesia Evaluation  Patient identified by MRN, date of birth, ID band Patient awake    Reviewed: Allergy & Precautions, NPO status , Patient's Chart, lab work & pertinent test results  Airway Mallampati: II  TM Distance: >3 FB Neck ROM: Full    Dental no notable dental hx.    Pulmonary neg pulmonary ROS   Pulmonary exam normal breath sounds clear to auscultation       Cardiovascular negative cardio ROS Normal cardiovascular exam Rhythm:Regular Rate:Normal     Neuro/Psych negative neurological ROS  negative psych ROS   GI/Hepatic negative GI ROS, Neg liver ROS,,,  Endo/Other  negative endocrine ROS    Renal/GU negative Renal ROS  negative genitourinary   Musculoskeletal negative musculoskeletal ROS (+)    Abdominal   Peds  Hematology negative hematology ROS (+)   Anesthesia Other Findings   Reproductive/Obstetrics (+) Pregnancy                             Anesthesia Physical Anesthesia Plan  ASA: II  Anesthesia Plan: Spinal   Post-op Pain Management:    Induction:   PONV Risk Score and Plan: 2 and Treatment may vary due to age or medical condition  Airway Management Planned: Natural Airway  Additional Equipment:   Intra-op Plan:   Post-operative Plan:   Informed Consent: I have reviewed the patients History and Physical, chart, labs and discussed the procedure including the risks, benefits and alternatives for the proposed anesthesia with the patient or authorized representative who has indicated his/her understanding and acceptance.       Plan Discussed with: Anesthesiologist  Anesthesia Plan Comments: (Patient identified. Risks, benefits, options discussed with patient including but not limited to bleeding, infection, nerve damage, paralysis, failed block, incomplete pain control, headache, blood pressure changes, nausea, vomiting, reactions to medication, itching,  and post partum back pain. Confirmed with bedside nurse the patient's most recent platelet count. Confirmed with the patient that they are not taking any anticoagulation, have any bleeding history or any family history of bleeding disorders. Patient expressed understanding and wishes to proceed. All questions were answered. )        Anesthesia Quick Evaluation

## 2022-10-29 NOTE — Transfer of Care (Signed)
Immediate Anesthesia Transfer of Care Note  Patient: Brianna Stewart  Procedure(s) Performed: CESAREAN SECTION  Patient Location: PACU  Anesthesia Type:Spinal  Level of Consciousness: awake, alert , and oriented  Airway & Oxygen Therapy: Patient Spontanous Breathing  Post-op Assessment: Report given to RN and Post -op Vital signs reviewed and stable  Post vital signs: Reviewed and stable  Last Vitals:  Vitals Value Taken Time  BP 92/54 10/29/22 1330  Temp    Pulse 70 10/29/22 1336  Resp 16 10/29/22 1336  SpO2 98 % 10/29/22 1336  Vitals shown include unvalidated device data.  Last Pain:  Vitals:   10/29/22 1035  TempSrc: Oral         Complications: No notable events documented.

## 2022-10-30 ENCOUNTER — Encounter (HOSPITAL_COMMUNITY): Payer: Self-pay | Admitting: Obstetrics and Gynecology

## 2022-10-30 LAB — CBC
HCT: 29.3 % — ABNORMAL LOW (ref 36.0–46.0)
Hemoglobin: 9.7 g/dL — ABNORMAL LOW (ref 12.0–15.0)
MCH: 30.1 pg (ref 26.0–34.0)
MCHC: 33.1 g/dL (ref 30.0–36.0)
MCV: 91 fL (ref 80.0–100.0)
Platelets: 202 10*3/uL (ref 150–400)
RBC: 3.22 MIL/uL — ABNORMAL LOW (ref 3.87–5.11)
RDW: 13.2 % (ref 11.5–15.5)
WBC: 11.9 10*3/uL — ABNORMAL HIGH (ref 4.0–10.5)
nRBC: 0 % (ref 0.0–0.2)

## 2022-10-30 NOTE — Progress Notes (Signed)
Subjective: Postpartum Day 1: Cesarean Delivery Patient reports tolerating PO, + flatus, and no problems voiding.  Lochia mild. She denies HA, CP, SOB. She is breast/bottlefeeding and bonding well with baby. She reports sore calves but"feels likel muscle aches from pumping them too much yesterday " - used SCDs. Doing well  Objective: Vital signs in last 24 hours: Temp:  [97.6 F (36.4 C)-98.6 F (37 C)] 98.5 F (36.9 C) (12/28 0600) Pulse Rate:  [59-84] 65 (12/28 0600) Resp:  [12-20] 16 (12/28 0600) BP: (91-113)/(53-69) 106/60 (12/28 0600) SpO2:  [95 %-100 %] 100 % (12/28 0600)  Physical Exam:  General: alert, cooperative, and no distress Lochia: appropriate Uterine Fundus: firm Incision: no significant drainage DVT Evaluation: No evidence of DVT seen on physical exam. Negative Homan's sign.  Recent Labs    10/28/22 0949 10/30/22 0511  HGB 11.7* 9.7*  HCT 34.6* 29.3*    Assessment/Plan: Status post Cesarean section. Doing well postoperatively.  Continue current care. Possible discharge home tomorrow   Cathrine Muster, DO 10/30/2022, 11:45 AM

## 2022-10-30 NOTE — Lactation Note (Addendum)
This note was copied from a baby's chart. Lactation Consultation Note  Patient Name: Brianna Stewart FXOVA'N Date: 10/30/2022 Reason for consult: Follow-up assessment;Mother's request;Difficult latch;Term;Infant weight loss (-4.75% weight loss .) Age:35 hours P2, term female infant, Birth Parent requested latch assistance, due painful latches, LC observe infant does have heart shape tongue and tongue has band of tissue that is thin but teeters close to the bottom of tongue tip and floor of infant's mouth.  Birth Parent recently BF infant but was painful and supplemented infant with formula, infant was still cuing and Birth Parent decided latch infant while LC was in the room for latch assistance. Per Birth Parent, her 1st child had tongue tie. LC discussed with Birth Parent to ask Pediatrician, MD to do oral assessment of infant's mouth.  LC worked on latch and positioning with Birth Parent to help infant have deeper latch. Birth Parent latched infant on her left breast using the cross cradle hold instead of cradle, Per Birth Parent, this position felt a lot better, not painful  and this is the  1st time Birth Parent has experiencing cramping when infant is latched at the breast. Birth Parent  knows to call RN/LC for further latch assistance if needed, Birth Parent is latching infant at the breast 1st and then supplementing infant with formula this is Birth Parent's feeding choice.  Maternal Data    Feeding Mother's Current Feeding Choice: Breast Milk and Formula Nipple Type: Extra Slow Flow  LATCH Score Latch: Grasps breast easily, tongue down, lips flanged, rhythmical sucking.  Audible Swallowing: Spontaneous and intermittent  Type of Nipple: Everted at rest and after stimulation  Comfort (Breast/Nipple): Filling, red/small blisters or bruises, mild/mod discomfort  Hold (Positioning): Assistance needed to correctly position infant at breast and maintain latch.  LATCH Score:  8   Lactation Tools Discussed/Used    Interventions Interventions: Support pillows;Position options;Skin to skin;Breast compression;Education;Adjust position;Assisted with latch  Discharge    Consult Status Consult Status: Follow-up Date: 10/31/22 Follow-up type: In-patient    [name] 10/30/2022, 8:18 PM

## 2022-10-30 NOTE — Lactation Note (Signed)
This note was copied from a baby's chart. Lactation Consultation Note  Patient Name: Brianna Stewart [id number] Date: 10/30/2022 Reason for consult: Initial assessment;Term;Infant weight loss (-5% weight loss) Age:35 hours, term female infant with 4 voids and 5 stools since birth.  Per Careers information officer.  her feeding preference is breast and formula feeding infant. Birth Parent will like latch assistance with the next feeding, LC written her name on the [employer] in Birth Parent's room, infant recently BF for 5 minutes and was supplemented with 20 mls of formula. Support Person was holding sleeping infant. Birth Parent is feeling pain and discomfort when infant is latching at the breast. Birth Parent will continue BF infant according to hunger cues, on demand, 8 to 12+ times within 24 hours, STS. LC discussed infant's input and output. LC discussed importance of maternal diet, hydration and rest with Birth Parent.Mom made aware of O/P services, breastfeeding support groups, community resources, and our phone # for post-discharge questions.   Maternal Data Has patient been taught Hand Expression?: Yes Does the patient have breastfeeding experience prior to this delivery?: Yes How long did the patient breastfeed?: Per Birth Parent, she briefly BF her 1st child for 2 months had latch difficulties and low milk supply .  Feeding Mother's Current Feeding Choice: Breast Milk and Formula  LATCH Score                    Lactation Tools Discussed/Used    Interventions Interventions: Breast feeding basics reviewed;Skin to skin;Position options;Education;LC Services brochure;Pace feeding  Discharge Pump: DEBP;Personal (Birth Parent has Elvie and Spectra 2 DEBP at home.)  Consult Status Consult Status: Follow-up Date: 10/31/22 Follow-up type: In-patient    [name] 10/30/2022, 3:41 PM

## 2022-10-31 ENCOUNTER — Ambulatory Visit: Payer: Self-pay

## 2022-10-31 MED ORDER — IBUPROFEN 600 MG PO TABS: 600.0000 mg | ORAL_TABLET | Freq: Four times a day (QID) | ORAL | 1 refills | Status: AC | PRN

## 2022-10-31 MED ORDER — ACETAMINOPHEN 325 MG PO TABS: 650.0000 mg | ORAL_TABLET | Freq: Four times a day (QID) | ORAL | Status: AC | PRN

## 2022-10-31 MED ORDER — OXYCODONE HCL 5 MG PO TABS: 5.0000 mg | ORAL_TABLET | ORAL | 0 refills | Status: AC | PRN

## 2022-10-31 NOTE — Lactation Note (Signed)
This note was copied from a baby's chart. Lactation Consultation Note  Patient Name: Brianna Stewart SHFWY'O Date: 10/31/2022 Reason for consult: Follow-up assessment;Infant weight loss Age:35 hours  P2,  14% weight loss.  Mother states baby is breastfeeding frequently, stools and voids WNL.  Discussed pumping in addition if baby becomes sleepy at the breast, additional weight loss or will not latch or no voids/stools. Mother will discuss short frenulum with Ped MD.  Mother denies questions or concerns. Reviewed engorgement care and monitoring voids/stools.   Maternal Data Has patient been taught Hand Expression?: Yes  Feeding Mother's Current Feeding Choice: Breast Milk and Formula Nipple Type: Extra Slow Flow  Lactation Tools Discussed/Used  DEBP if baby becomes sleepy at the breast, no voids or stools or not latching  Interventions Interventions: Education  Discharge Discharge Education: Engorgement and breast care;Warning signs for feeding baby  Consult Status Consult Status: Complete Date: 10/31/22    Dahlia Byes [facility] 10/31/2022, 11:18 AM

## 2022-10-31 NOTE — Discharge Summary (Signed)
   Postpartum Discharge Summary  Date of Service updated 10/31/22      Patient Name: Brianna Stewart DOB: 08/08/1986 MRN: [id number]  Date of admission: 10/29/2022 Delivery date:10/29/2022  Delivering provider: MARINONE, MICHELLE E  Date of discharge: 10/31/2022  Admitting diagnosis: Previous Intrauterine pregnancy: [redacted]w[redacted]d     Secondary diagnosis:  Active Problems:   History of cesarean delivery  Additional problems: none    Discharge diagnosis: Term Pregnancy Delivered                                              Post partum procedures: none Augmentation: N/A Complications: None  Hospital course: Sceduled C/S   36 y.o. yo G3P2012 at [redacted]w[redacted]d was admitted to the hospital 10/29/2022 for scheduled cesarean section with the following indication:Elective Repeat.Delivery details are as follows:  Membrane Rupture Time/Date: 12:41 PM ,10/29/2022   Delivery Method:C-Section, Low Transverse  Details of operation can be found in separate operative note.  Patient had a postpartum course complicated by nothing.  She is ambulating, tolerating a regular diet, passing flatus, and urinating well. Patient is discharged home in stable condition on  10/31/22        Newborn Data: Birth date:10/29/2022  Birth time:12:42 PM  Gender:Female  Living status:Living  Apgars:9 ,9  Weight:3720 g     Magnesium Sulfate received: No BMZ received: No Rhophylac:N/A MMR:N/A T-DaP:Given prenatally Flu: No Transfusion:No  Physical exam  Vitals:   10/30/22 0600 10/30/22 1500 10/30/22 2100 10/31/22 0516  BP: 106/60 (!) 99/52 92/70 103/63  Pulse: 65 66 64 (!) 57  Resp: 16 17 15 16  Temp: 98.5 F (36.9 C) 97.7 F (36.5 C) 97.6 F (36.4 C) 97.7 F (36.5 C)  TempSrc: Oral Oral Bladder Oral  SpO2: 100%  98% 98%  Weight:      Height:       General: alert, cooperative, and no distress Lochia: appropriate Uterine Fundus: firm Incision: Healing well with no significant drainage, No significant erythema,  Dressing is clean, dry, and intact DVT Evaluation: No evidence of DVT seen on physical exam. Labs: Lab Results  Component Value Date   WBC 11.9 (H) 10/30/2022   HGB 9.7 (L) 10/30/2022   HCT 29.3 (L) 10/30/2022   MCV 91.0 10/30/2022   PLT 202 10/30/2022       No data to display         Edinburgh Score:    10/29/2022    3:50 PM  Edinburgh Postnatal Depression Scale Screening Tool  I have been able to laugh and see the funny side of things. 0  I have looked forward with enjoyment to things. 0  I have blamed myself unnecessarily when things went wrong. 0  I have been anxious or worried for no good reason. 0  I have felt scared or panicky for no good reason. 0  Things have been getting on top of me. 0  I have been so unhappy that I have had difficulty sleeping. 0  I have felt sad or miserable. 0  I have been so unhappy that I have been crying. 0  The thought of harming myself has occurred to me. 0  Edinburgh Postnatal Depression Scale Total 0      After visit meds:  Allergies as of 10/31/2022   No Known Allergies      Medication List       TAKE these medications    acetaminophen 325 MG tablet Commonly known as: TYLENOL Take 2 tablets (650 mg total) by mouth every 6 (six) hours as needed.   CHOLINE PO Take 1 tablet by mouth daily.   ferrous sulfate 325 (65 FE) MG EC tablet Take 325 mg by mouth 3 (three) times a week.   ibuprofen 600 MG tablet Commonly known as: ADVIL Take 1 tablet (600 mg total) by mouth every 6 (six) hours as needed.   oxyCODONE 5 MG immediate release tablet Commonly known as: Oxy IR/ROXICODONE Take 1 tablet (5 mg total) by mouth every 4 (four) hours as needed for moderate pain or severe pain.   prenatal multivitamin Tabs tablet Take 1 tablet by mouth daily.   propranolol 40 MG tablet Commonly known as: INDERAL Take 40 mg by mouth daily as needed (on work days).         Discharge home in stable condition Infant Feeding:  Breast Infant Disposition:home with mother Discharge instruction: per After Visit Summary and Postpartum booklet. Activity: Advance as tolerated. Pelvic rest for 6 weeks.  Diet: iron rich diet Anticipated Birth Control: Unsure Postpartum Appointment:4 weeks Additional Postpartum F/U:  none Future Appointments:No future appointments. Follow up Visit:  Follow-up Information     Marinone, Michelle E, MD. Schedule an appointment as soon as possible for a visit in 4 week(s).   Specialty: Obstetrics and Gynecology Contact information: [address] [place] [address] [phone]                     10/31/2022 Michelle E Marinone, MD   

## 2022-10-31 NOTE — Progress Notes (Addendum)
Subjective: Postpartum Day 2: Cesarean Delivery Doing well with no complaints. Ambulating, voiding, tolerating PO. Minimal lochia. Breastfeeding.   Objective: Patient Vitals for the past 24 hrs:  BP Temp Temp src Pulse Resp SpO2  10/31/22 0516 103/63 97.7 F (36.5 C) Oral (!) 57 16 98 %  10/30/22 2100 92/70 97.6 F (36.4 C) Bladder 64 15 98 %  10/30/22 1500 (!) 99/52 97.7 F (36.5 C) Oral 66 17 --   Physical Exam:  General: alert, cooperative, and no distress Lochia: appropriate Uterine Fundus: firm Incision: healing well, no significant drainage, no dehiscence, no significant erythema DVT Evaluation: No evidence of DVT seen on physical exam.  Recent Labs    10/28/22 0949 10/30/22 0511  HGB 11.7* 9.7*  HCT 34.6* 29.3*    Assessment/Plan:  Brianna Stewart [id number] POD#2 sp repeat cesarean at [redacted]w[redacted]d 1. PPC: routine PP care 2. Rh pos 3. ABLA and anemia of pregnancy: continue home iron 4. Dispo: stable for discharge today. Discharge instructions reviewed.   [name] 10/31/2022, 8:37 AM

## 2022-11-06 ENCOUNTER — Telehealth (HOSPITAL_COMMUNITY): Payer: Self-pay | Admitting: *Deleted

## 2022-11-06 NOTE — Telephone Encounter (Signed)
Attempted Hospital Discharge Follow-Up Call.  Left voice mail requesting that patient return RN's phone call if patient has any concerns or questions regarding herself or her baby.  

## 2023-07-22 ENCOUNTER — Other Ambulatory Visit: Payer: Self-pay | Admitting: Plastic Surgery

## 2023-07-22 DIAGNOSIS — Z1231 Encounter for screening mammogram for malignant neoplasm of breast: Secondary | ICD-10-CM

## 2023-08-12 ENCOUNTER — Ambulatory Visit
Admission: RE | Admit: 2023-08-12 | Discharge: 2023-08-12 | Disposition: A | Discharge status: Home / Self Care | Payer: BC Managed Care – PPO | Source: Ambulatory Visit | Attending: Plastic Surgery | Admitting: Plastic Surgery

## 2023-08-12 DIAGNOSIS — Z1231 Encounter for screening mammogram for malignant neoplasm of breast: Secondary | ICD-10-CM

## 2024-01-20 ENCOUNTER — Other Ambulatory Visit: Payer: Self-pay | Admitting: Obstetrics and Gynecology

## 2024-01-20 DIAGNOSIS — N6310 Unspecified lump in the right breast, unspecified quadrant: Secondary | ICD-10-CM

## 2024-02-17 ENCOUNTER — Ambulatory Visit
Admission: RE | Admit: 2024-02-17 | Discharge: 2024-02-17 | Disposition: A | Discharge status: Home / Self Care | Source: Ambulatory Visit | Attending: Obstetrics and Gynecology | Admitting: Obstetrics and Gynecology

## 2024-02-17 DIAGNOSIS — N6310 Unspecified lump in the right breast, unspecified quadrant: Secondary | ICD-10-CM
